# Patient Record
Sex: Female | Born: 1950 | Race: White | Hispanic: No | Smoking: Current every day smoker
Health system: Southern US, Community
[De-identification: ages and names within clinical notes are randomized; demographics above are authoritative.]

## PROBLEM LIST (undated history)

## (undated) DIAGNOSIS — I10 Essential (primary) hypertension: Secondary | ICD-10-CM

## (undated) DIAGNOSIS — E119 Type 2 diabetes mellitus without complications: Secondary | ICD-10-CM

## (undated) HISTORY — PX: ABDOMINAL HYSTERECTOMY: SHX81

---

## 2017-03-04 ENCOUNTER — Ambulatory Visit: Payer: Self-pay | Admitting: Psychiatry

## 2017-03-06 ENCOUNTER — Emergency Department
Admission: EM | Admit: 2017-03-06 | Discharge: 2017-03-06 | Disposition: A | Discharge status: Home / Self Care | Payer: Medicare HMO | Attending: Emergency Medicine | Admitting: Emergency Medicine

## 2017-03-06 ENCOUNTER — Emergency Department: Payer: Medicare HMO

## 2017-03-06 ENCOUNTER — Other Ambulatory Visit: Payer: Self-pay

## 2017-03-06 DIAGNOSIS — F17210 Nicotine dependence, cigarettes, uncomplicated: Secondary | ICD-10-CM | POA: Insufficient documentation

## 2017-03-06 DIAGNOSIS — E119 Type 2 diabetes mellitus without complications: Secondary | ICD-10-CM | POA: Insufficient documentation

## 2017-03-06 DIAGNOSIS — S0990XA Unspecified injury of head, initial encounter: Secondary | ICD-10-CM

## 2017-03-06 DIAGNOSIS — Z79899 Other long term (current) drug therapy: Secondary | ICD-10-CM | POA: Diagnosis not present

## 2017-03-06 DIAGNOSIS — Y998 Other external cause status: Secondary | ICD-10-CM | POA: Insufficient documentation

## 2017-03-06 DIAGNOSIS — W07XXXA Fall from chair, initial encounter: Secondary | ICD-10-CM | POA: Insufficient documentation

## 2017-03-06 DIAGNOSIS — R531 Weakness: Secondary | ICD-10-CM | POA: Insufficient documentation

## 2017-03-06 DIAGNOSIS — N390 Urinary tract infection, site not specified: Secondary | ICD-10-CM | POA: Insufficient documentation

## 2017-03-06 DIAGNOSIS — Z7984 Long term (current) use of oral hypoglycemic drugs: Secondary | ICD-10-CM | POA: Diagnosis not present

## 2017-03-06 DIAGNOSIS — I10 Essential (primary) hypertension: Secondary | ICD-10-CM | POA: Insufficient documentation

## 2017-03-06 DIAGNOSIS — Y92009 Unspecified place in unspecified non-institutional (private) residence as the place of occurrence of the external cause: Secondary | ICD-10-CM | POA: Diagnosis not present

## 2017-03-06 DIAGNOSIS — Y939 Activity, unspecified: Secondary | ICD-10-CM | POA: Insufficient documentation

## 2017-03-06 HISTORY — DX: Type 2 diabetes mellitus without complications: E11.9

## 2017-03-06 HISTORY — DX: Essential (primary) hypertension: I10

## 2017-03-06 LAB — CBC WITH DIFFERENTIAL/PLATELET
BASOS ABS: 0 10*3/uL (ref 0–0.1)
Basophils Relative: 0 %
Eosinophils Absolute: 0 10*3/uL (ref 0–0.7)
Eosinophils Relative: 0 %
HEMATOCRIT: 40.7 % (ref 35.0–47.0)
HEMOGLOBIN: 13.4 g/dL (ref 12.0–16.0)
Lymphocytes Relative: 5 %
Lymphs Abs: 0.5 10*3/uL — ABNORMAL LOW (ref 1.0–3.6)
MCH: 27.5 pg (ref 26.0–34.0)
MCHC: 32.9 g/dL (ref 32.0–36.0)
MCV: 83.6 fL (ref 80.0–100.0)
Monocytes Absolute: 0.4 10*3/uL (ref 0.2–0.9)
Monocytes Relative: 4 %
NEUTROS ABS: 9.2 10*3/uL — AB (ref 1.4–6.5)
Neutrophils Relative %: 91 %
Platelets: 277 10*3/uL (ref 150–440)
RBC: 4.86 MIL/uL (ref 3.80–5.20)
RDW: 14.8 % — ABNORMAL HIGH (ref 11.5–14.5)
WBC: 10.2 10*3/uL (ref 3.6–11.0)

## 2017-03-06 LAB — URINALYSIS, COMPLETE (UACMP) WITH MICROSCOPIC
BILIRUBIN URINE: NEGATIVE
Glucose, UA: NEGATIVE mg/dL
Ketones, ur: 5 mg/dL — AB
Nitrite: POSITIVE — AB
PROTEIN: 30 mg/dL — AB
SPECIFIC GRAVITY, URINE: 1.016 (ref 1.005–1.030)
pH: 5 (ref 5.0–8.0)

## 2017-03-06 LAB — COMPREHENSIVE METABOLIC PANEL
ALT: 24 U/L (ref 14–54)
AST: 36 U/L (ref 15–41)
Albumin: 3.9 g/dL (ref 3.5–5.0)
Alkaline Phosphatase: 86 U/L (ref 38–126)
Anion gap: 10 (ref 5–15)
BILIRUBIN TOTAL: 0.8 mg/dL (ref 0.3–1.2)
BUN: 13 mg/dL (ref 6–20)
CHLORIDE: 101 mmol/L (ref 101–111)
CO2: 26 mmol/L (ref 22–32)
CREATININE: 0.78 mg/dL (ref 0.44–1.00)
Calcium: 9.3 mg/dL (ref 8.9–10.3)
GFR calc Af Amer: >60 mL/min (ref >60)
Glucose, Bld: 132 mg/dL — ABNORMAL HIGH (ref 65–99)
Potassium: 3.7 mmol/L (ref 3.5–5.1)
Sodium: 137 mmol/L (ref 135–145)
TOTAL PROTEIN: 7.7 g/dL (ref 6.5–8.1)

## 2017-03-06 LAB — SALICYLATE LEVEL: Salicylate Lvl: <7 mg/dL (ref 2.8–30.0)

## 2017-03-06 LAB — URINE DRUG SCREEN, QUALITATIVE (ARMC ONLY)
Amphetamines, Ur Screen: NOT DETECTED
BARBITURATES, UR SCREEN: NOT DETECTED
BENZODIAZEPINE, UR SCRN: NOT DETECTED
Cannabinoid 50 Ng, Ur ~~LOC~~: NOT DETECTED
Cocaine Metabolite,Ur ~~LOC~~: NOT DETECTED
MDMA (Ecstasy)Ur Screen: NOT DETECTED
METHADONE SCREEN, URINE: NOT DETECTED
Opiate, Ur Screen: NOT DETECTED
Phencyclidine (PCP) Ur S: NOT DETECTED
TRICYCLIC, UR SCREEN: POSITIVE — AB

## 2017-03-06 LAB — CK: CK TOTAL: 170 U/L (ref 38–234)

## 2017-03-06 LAB — ACETAMINOPHEN LEVEL

## 2017-03-06 LAB — ETHANOL

## 2017-03-06 LAB — TROPONIN I

## 2017-03-06 MED ORDER — CEPHALEXIN 500 MG PO CAPS
500.0000 mg | ORAL_CAPSULE | Freq: Once | ORAL | Status: AC
  Administered 2017-03-06: 500 mg via ORAL
  Filled 2017-03-06: qty 1

## 2017-03-06 MED ORDER — CEPHALEXIN 500 MG PO CAPS: 500.0000 mg | ORAL_CAPSULE | Freq: Three times a day (TID) | ORAL | 0 refills | Status: AC

## 2017-03-06 NOTE — ED Triage Notes (Signed)
Pt arrived via ems for report of fall at 4am and weakness - pt has been laying in the floor since 4am - pt states that she was in her chair and leaned forward and fell face first on the floor causing laceration to bridge of nose - pt is A&O x3 and moved herself from stretcher to ED stretcher

## 2017-03-06 NOTE — ED Notes (Signed)
Pt ambulated from bed 1/4 a way around nursing station with cane and no assist without difficulty

## 2017-03-06 NOTE — ED Provider Notes (Signed)
[facility] Emergency Department Provider Note  ___________________________________________   First MD Initiated Contact with Patient 03/06/17 1721     (approximate)  I have reviewed the triage vital signs and the nursing notes.   HISTORY  Chief Complaint Fall and Weakness   HPI Sierra Bass is a 66 y.o. female with a history of diabetes as well as hypertension was presenting to the emergency department today after falling out of her recliner and being unable to get up.  Family member the patient was trying to contact her and was unable to get in touch.  EMS was called and upon arrival the patient reported being down on the floor since 4 AM this morning.  She says that she fell out of her recliner, hitting her nose and face on the floor.  She reports her tetanus shot being in the past 10 years.  She reports no pain at this time.  Denies loss of consciousness.  EMS reports that they were unable to have the patient walk because of the patient being unable to support her weight with her legs.  Patient does usually use a cane for help with ambulation.  Patient is denying any hip pain, chest pain or palpitations.  Denies any fever or body aches.  Patient has urinated on herself since being down on the floor over the past several hours.  Past Medical History:  Diagnosis Date  . Diabetes mellitus without complication (HCC)   . Hypertension     There are no active problems to display for this patient.   Past Surgical History:  Procedure Laterality Date  . ABDOMINAL HYSTERECTOMY    . CESAREAN SECTION      Prior to Admission medications   Medication Sig Start Date End Date Taking? Authorizing Provider  gabapentin (NEURONTIN) 300 MG capsule Take 300 mg by mouth 3 (three) times daily. 11/27/16   [provider]  lisinopril (PRINIVIL,ZESTRIL) 40 MG tablet Take 1 tablet by mouth daily. 03/02/17   [provider]  meloxicam (MOBIC) 15 MG tablet Take 1  tablet by mouth daily. 12/29/16   [provider]  metFORMIN (GLUCOPHAGE) 1000 MG tablet Take 1 tablet by mouth 2 (two) times daily. 12/06/16   [provider]  oxybutynin (DITROPAN-XL) 10 MG 24 hr tablet Take 25 mg by mouth daily.  02/23/17   [provider]  pramipexole (MIRAPEX) 1.5 MG tablet Take 1 tablet by mouth daily. 01/03/17   [provider]  ramipril (ALTACE) 10 MG capsule Take 1 capsule by mouth daily. 11/30/16   [provider]  simvastatin (ZOCOR) 10 MG tablet Take 1 tablet by mouth daily. 12/23/16   [provider]  venlafaxine XR (EFFEXOR-XR) 150 MG 24 hr capsule Take 2 capsules by mouth daily. 11/27/16   [provider]    Allergies Patient has no known allergies.  No family history on file.  Social History Social History   Tobacco Use  . Smoking status: Current Every Day Smoker    Packs/day: 0.50    Types: Cigarettes  . Smokeless tobacco: Never Used  Substance Use Topics  . Alcohol use: No    Frequency: Never  . Drug use: No    Review of Systems  Constitutional: No fever/chills Eyes: No visual changes. ENT: No sore throat. Cardiovascular: Denies chest pain. Respiratory: Denies shortness of breath. Gastrointestinal: No abdominal pain.  No nausea, no vomiting.  No diarrhea.  No constipation. Genitourinary: Negative for dysuria. Musculoskeletal: Negative for back pain.  Skin: Negative for rash. Neurological: Negative for headaches, focal weakness or numbness.   ____________________________________________   PHYSICAL EXAM:  VITAL SIGNS: ED Triage Vitals  Enc Vitals Group     BP 03/06/17 1719 136/76     Pulse Rate 03/06/17 1719 82     Resp 03/06/17 1719 (!) 21     Temp 03/06/17 1719 97.7 F (36.5 C)     Temp Source 03/06/17 1719 Oral     SpO2 03/06/17 1719 94 %     Weight 03/06/17 1714 260 lb (117.9 kg)     Height 03/06/17 1714 5\' 4"  (1.626 m)     Head Circumference --      Peak Flow --       Pain Score 03/06/17 1714 0     Pain Loc --      Pain Edu? --      Excl. in GC? --     Constitutional: Alert and oriented.  in no acute distress. Eyes: Conjunctivae are normal.  Head: Atraumatic. Nose: Abrasion overlying the nasal bridge but without laceration.  Swelling to the nose diffusely but without any nasal septal hematoma.  Mild crusted blood to the bilateral naris without active bleed visualized.  Tenderness to palpation to the bony structures of the nose externally.   Mouth/Throat: Mucous membranes are moist.  Neck: No stridor.  No tenderness to the cervical spine.  No deformity or step-off. Cardiovascular: Normal rate, regular rhythm. Grossly normal heart sounds.  Good peripheral circulation. Respiratory: Normal respiratory effort.  No retractions. Lungs CTAB. Gastrointestinal: Soft and nontender. No distention.  Musculoskeletal: No lower extremity tenderness nor edema.  No joint effusions.  Pelvis is stable.  No tenderness to palpation of the bilateral hips.  5 out of 5 strength in bilateral lower extremities.  No sensory deficits. Neurologic:  Normal speech and language. No gross focal neurologic deficits are appreciated. Skin:  Skin is warm, dry and intact. No rash noted. Psychiatric: Mood and affect are normal. Speech and behavior are normal.  ____________________________________________   LABS (all labs ordered are listed, but only abnormal results are displayed)  Labs Reviewed  CBC WITH DIFFERENTIAL/PLATELET - Abnormal; Notable for the following components:      Result Value   RDW 14.8 (*)    Neutro Abs 9.2 (*)    Lymphs Abs 0.5 (*)    All other components within normal limits  COMPREHENSIVE METABOLIC PANEL - Abnormal; Notable for the following components:   Glucose, Bld 132 (*)    All other components within normal limits  URINALYSIS, COMPLETE (UACMP) WITH MICROSCOPIC - Abnormal; Notable for the following components:   Color, Urine YELLOW (*)    APPearance HAZY  (*)    Hgb urine dipstick SMALL (*)    Ketones, ur 5 (*)    Protein, ur 30 (*)    Nitrite POSITIVE (*)    Leukocytes, UA TRACE (*)    Bacteria, UA MANY (*)    Squamous Epithelial / LPF 0-5 (*)    All other components within normal limits  URINE DRUG SCREEN, QUALITATIVE ([facility] ONLY) - Abnormal; Notable for the following components:   Tricyclic, Ur Screen POSITIVE (*)    All other components within normal limits  ACETAMINOPHEN LEVEL - Abnormal; Notable for the following components:   Acetaminophen (Tylenol), Serum <10 (*)    All other components within normal limits  ETHANOL  TROPONIN I  CK  SALICYLATE LEVEL   ____________________________________________  EKG  ED ECG REPORT I, Onalee Hua  Renard HamperM Manases Etchison, the attending physician, personally viewed and interpreted this ECG.   Date: 03/06/2017  EKG Time: 1719  Rate: 91  Rhythm: normal sinus rhythm  Axis: Normal  Intervals:none  ST&T Change: No ST segment elevation or depression.  No abnormal T wave inversion.  ____________________________________________  RADIOLOGY  Patient without acute finding on the CT maxillofacial, head as well as the chest x-ray and pelvic x-ray. ____________________________________________   PROCEDURES  Procedure(s) performed:   Procedures  Critical Care performed:   ____________________________________________   INITIAL IMPRESSION / ASSESSMENT AND PLAN / ED COURSE  Pertinent labs & imaging results that were available during my care of the patient were reviewed by me and considered in my medical decision making (see chart for details).  DDX: Weakness, hip fracture, pelvic fracture, UTI, nasal bone fracture, electrolyte abnormality, ACS, rhabdomyolysis, alcohol intoxication, drug abuse  As part of my medical decision making, I reviewed the following data within the electronic MEDICAL RECORD NUMBER Notes from prior ED visits     ----------------------------------------- 7:51 PM on  03/06/2017 -----------------------------------------  Patient found to have UTI.  Able to walk at her baseline with cane.  Patient without any complaints at this time.  Updated about her diagnosis of UTI.  Will be discharged with Keflex.  Patient is understanding of the plan willing to comply.  Patient not showing any focal weakness or generalized weakness at this time.  I feel she is appropriate for treatment at home.  ____________________________________________   FINAL CLINICAL IMPRESSION(S) / ED DIAGNOSES  Fall.  Head injury.  UTI.  Weakness.    NEW MEDICATIONS STARTED DURING THIS VISIT:  This SmartLink is deprecated. Use AVSMEDLIST instead to display the medication list for a patient.   Note:  This document was prepared using Dragon voice recognition software and may include unintentional dictation errors.     Myrna BlazerSchaevitz, Alcie Runions Matthew, MD 03/06/17 [phone]

## 2017-03-09 LAB — URINE CULTURE

## 2017-03-27 ENCOUNTER — Emergency Department: Payer: Medicare HMO

## 2017-03-27 ENCOUNTER — Encounter: Payer: Self-pay | Admitting: Emergency Medicine

## 2017-03-27 ENCOUNTER — Emergency Department
Admission: EM | Admit: 2017-03-27 | Discharge: 2017-03-27 | Disposition: A | Discharge status: Home / Self Care | Payer: Medicare HMO | Attending: Emergency Medicine | Admitting: Emergency Medicine

## 2017-03-27 ENCOUNTER — Other Ambulatory Visit: Payer: Self-pay

## 2017-03-27 DIAGNOSIS — W06XXXA Fall from bed, initial encounter: Secondary | ICD-10-CM | POA: Diagnosis not present

## 2017-03-27 DIAGNOSIS — Z7984 Long term (current) use of oral hypoglycemic drugs: Secondary | ICD-10-CM | POA: Insufficient documentation

## 2017-03-27 DIAGNOSIS — F17210 Nicotine dependence, cigarettes, uncomplicated: Secondary | ICD-10-CM | POA: Insufficient documentation

## 2017-03-27 DIAGNOSIS — Z79899 Other long term (current) drug therapy: Secondary | ICD-10-CM | POA: Diagnosis not present

## 2017-03-27 DIAGNOSIS — R531 Weakness: Secondary | ICD-10-CM | POA: Insufficient documentation

## 2017-03-27 DIAGNOSIS — I10 Essential (primary) hypertension: Secondary | ICD-10-CM | POA: Insufficient documentation

## 2017-03-27 DIAGNOSIS — W19XXXA Unspecified fall, initial encounter: Secondary | ICD-10-CM

## 2017-03-27 DIAGNOSIS — E119 Type 2 diabetes mellitus without complications: Secondary | ICD-10-CM | POA: Insufficient documentation

## 2017-03-27 DIAGNOSIS — I6782 Cerebral ischemia: Secondary | ICD-10-CM | POA: Insufficient documentation

## 2017-03-27 DIAGNOSIS — Z9181 History of falling: Secondary | ICD-10-CM | POA: Diagnosis not present

## 2017-03-27 LAB — CBC WITH DIFFERENTIAL/PLATELET
BASOS ABS: 0 10*3/uL (ref 0–0.1)
BASOS PCT: 1 %
EOS ABS: 0.2 10*3/uL (ref 0–0.7)
Eosinophils Relative: 3 %
HCT: 37.8 % (ref 35.0–47.0)
HEMOGLOBIN: 12.2 g/dL (ref 12.0–16.0)
Lymphocytes Relative: 13 %
Lymphs Abs: 0.8 10*3/uL — ABNORMAL LOW (ref 1.0–3.6)
MCH: 27 pg (ref 26.0–34.0)
MCHC: 32.4 g/dL (ref 32.0–36.0)
MCV: 83.4 fL (ref 80.0–100.0)
Monocytes Absolute: 0.5 10*3/uL (ref 0.2–0.9)
Monocytes Relative: 7 %
NEUTROS PCT: 76 %
Neutro Abs: 4.9 10*3/uL (ref 1.4–6.5)
Platelets: 265 10*3/uL (ref 150–440)
RBC: 4.53 MIL/uL (ref 3.80–5.20)
RDW: 15.2 % — ABNORMAL HIGH (ref 11.5–14.5)
WBC: 6.5 10*3/uL (ref 3.6–11.0)

## 2017-03-27 LAB — COMPREHENSIVE METABOLIC PANEL
ALT: 16 U/L (ref 14–54)
AST: 25 U/L (ref 15–41)
Albumin: 3.5 g/dL (ref 3.5–5.0)
Alkaline Phosphatase: 86 U/L (ref 38–126)
Anion gap: 7 (ref 5–15)
BUN: 7 mg/dL (ref 6–20)
CHLORIDE: 102 mmol/L (ref 101–111)
CO2: 29 mmol/L (ref 22–32)
Calcium: 8.8 mg/dL — ABNORMAL LOW (ref 8.9–10.3)
Creatinine, Ser: 0.46 mg/dL (ref 0.44–1.00)
Glucose, Bld: 112 mg/dL — ABNORMAL HIGH (ref 65–99)
POTASSIUM: 3.7 mmol/L (ref 3.5–5.1)
SODIUM: 138 mmol/L (ref 135–145)
Total Bilirubin: 0.6 mg/dL (ref 0.3–1.2)
Total Protein: 7 g/dL (ref 6.5–8.1)

## 2017-03-27 LAB — URINALYSIS, COMPLETE (UACMP) WITH MICROSCOPIC
BILIRUBIN URINE: NEGATIVE
Bacteria, UA: NONE SEEN
Glucose, UA: NEGATIVE mg/dL
HGB URINE DIPSTICK: NEGATIVE
Ketones, ur: NEGATIVE mg/dL
Leukocytes, UA: NEGATIVE
NITRITE: NEGATIVE
PROTEIN: NEGATIVE mg/dL
SPECIFIC GRAVITY, URINE: 1.005 (ref 1.005–1.030)
pH: 7 (ref 5.0–8.0)

## 2017-03-27 LAB — LIPASE, BLOOD: LIPASE: 18 U/L (ref 11–51)

## 2017-03-27 LAB — BRAIN NATRIURETIC PEPTIDE: B Natriuretic Peptide: 62 pg/mL (ref 0.0–100.0)

## 2017-03-27 LAB — TROPONIN I

## 2017-03-27 NOTE — ED Triage Notes (Signed)
Pt to ED via [facility] from [facility], pt has fallen x 2 today. First fall around 0630, pt refused transport at that time, about 15 minutes after EMS left, pt slide off the bed and has been in the floor since. Pt has laceration to the bridge of the nose and abrasion to the right knee.  VSS with EMS, CBG 113. Pt denies LOC or use of blood thinners. Pt in NAD at this time.

## 2017-03-27 NOTE — ED Notes (Signed)
Pt able to ambulate and maintain O2 Saturation of 95%

## 2017-03-27 NOTE — Discharge Instructions (Signed)
please follow-up with your regular doctor or see the [facility] or the [facility] or the [facility] or the [facility] or [facility] or the [facility] or you can try [facility] if need be. physical therapy might be useful for you to improve your strength. These returned here for feeling worse further falling fever vomiting or any other problems.

## 2017-03-27 NOTE — ED Notes (Signed)
Pt was given phone to call son for ride home upon discharge.

## 2017-03-27 NOTE — ED Provider Notes (Signed)
[facility] Emergency Department Provider Note   ____________________________________________   First MD Initiated Contact with Patient 03/27/17 1357     (approximate)  I have reviewed the triage vital signs and the nursing notes.   HISTORY  Chief Complaint Fall    HPI Sierra Bass is a 66 y.o. female Who has been having increasing weakness and increasing in number falls. [place] but has not found an apartment yet. She is living in a hotel. She fell once today called EMS but refused transport and then she slid off the side of the bed again it was on the floor. She's been unable to get up. She was in the ER recently with the same symptoms and had a UTI at that time. She denies any loss of consciousness headache chest pain shortness of breath coughing etc. she says she feels fairly well right now actuallyjust tired.   Past Medical History:  Diagnosis Date  . Diabetes mellitus without complication (HCC)   . Hypertension     There are no active problems to display for this patient.   Past Surgical History:  Procedure Laterality Date  . ABDOMINAL HYSTERECTOMY    . CESAREAN SECTION      Prior to Admission medications   Medication Sig Start Date End Date Taking? Authorizing Provider  gabapentin (NEURONTIN) 300 MG capsule Take 300 mg by mouth 3 (three) times daily. 11/27/16   [provider]  lisinopril (PRINIVIL,ZESTRIL) 40 MG tablet Take 1 tablet by mouth daily. 03/02/17   [provider]  meloxicam (MOBIC) 15 MG tablet Take 1 tablet by mouth daily. 12/29/16   [provider]  metFORMIN (GLUCOPHAGE) 1000 MG tablet Take 1 tablet by mouth 2 (two) times daily. 12/06/16   [provider]  oxybutynin (DITROPAN-XL) 10 MG 24 hr tablet Take 25 mg by mouth daily.  02/23/17   [provider]  pramipexole (MIRAPEX) 1.5 MG tablet Take 1 tablet by mouth daily. 01/03/17    [provider]  ramipril (ALTACE) 10 MG capsule Take 1 capsule by mouth daily. 11/30/16   [provider]  simvastatin (ZOCOR) 10 MG tablet Take 1 tablet by mouth daily. 12/23/16   [provider]  venlafaxine XR (EFFEXOR-XR) 150 MG 24 hr capsule Take 2 capsules by mouth daily. 11/27/16   [provider]    Allergies Abilify [aripiprazole]  No family history on file.  Social History Social History   Tobacco Use  . Smoking status: Current Every Day Smoker    Packs/day: 0.50    Types: Cigarettes  . Smokeless tobacco: Never Used  Substance Use Topics  . Alcohol use: No    Frequency: Never  . Drug use: No    Review of Systems  Constitutional: No fever/chills Eyes: No visual changes. ENT: No sore throat. Cardiovascular: Denies chest pain. Respiratory: Denies shortness of breath. Gastrointestinal: No abdominal pain.  No nausea, no vomiting.  No diarrhea.  No constipation. Genitourinary: Negative for dysuria. Musculoskeletal: Negative for back pain. Skin: Negative for rash. Neurological: Negative for headaches, focal weakness   ____________________________________________   PHYSICAL EXAM:  VITAL SIGNS: ED Triage Vitals  Enc Vitals Group     BP      Pulse      Resp      Temp      Temp src      SpO2      Weight  Height      Head Circumference      Peak Flow      Pain Score      Pain Loc      Pain Edu?      Excl. in GC?     Constitutional: Alert and oriented. Well appearing and in no acute distress. Eyes: Conjunctivae are normal.  Head: Atraumatic. Nose: No congestion/rhinnorhea. Mouth/Throat: Mucous membranes are moist.  Oropharynx non-erythematous. Neck: No stridor.   Cardiovascular: Normal rate, regular rhythm. Grossly normal heart sounds.  Good peripheral circulation. Respiratory: Normal respiratory effort.  No retractions. Lungs CTAB. Gastrointestinal: Soft and nontender. No distention. No abdominal bruits. No CVA  tenderness. Musculoskeletal: No lower extremity tenderness nor edema.  No joint effusions. Neurologic:  Normal speech and language. No gross focal neurologic deficits are appreciated. No gait instability. Skin:  Skin is warm, dry and intact except for a scabbing abrasion on the bridge of her nose and another 2 small scabs on the right knee. None of these are bigger than dime size.. slight redness on the right shin. The area is not warm. Patient says she had cellulitis there but is now much better. Psychiatric: Mood and affect are normal. Speech and behavior are normal.  ____________________________________________   LABS (all labs ordered are listed, but only abnormal results are displayed)  Labs Reviewed  COMPREHENSIVE METABOLIC PANEL - Abnormal; Notable for the following components:      Result Value   Glucose, Bld 112 (*)    Calcium 8.8 (*)    All other components within normal limits  CBC WITH DIFFERENTIAL/PLATELET - Abnormal; Notable for the following components:   RDW 15.2 (*)    Lymphs Abs 0.8 (*)    All other components within normal limits  URINALYSIS, COMPLETE (UACMP) WITH MICROSCOPIC - Abnormal; Notable for the following components:   Color, Urine STRAW (*)    APPearance CLEAR (*)    Squamous Epithelial / LPF 0-5 (*)    All other components within normal limits  LIPASE, BLOOD  TROPONIN I  BRAIN NATRIURETIC PEPTIDE   ____________________________________________  EKG  EKG read and interpreted by me shows normal sinus rhythm at 69 normal axis and low voltage in the chest leads but no apparent acute changes. She does have an S1 every 3 T3 which is not short of breath is not tachycardic and has no chest pain. ____________________________________________  RADIOLOGY  Dg Chest 2 View  Result Date: 03/27/2017 CLINICAL DATA:  Generalized weakness over the several past month. EXAM: CHEST  2 VIEW COMPARISON:  March 06, 2017 FINDINGS: The mediastinal contour is normal. The  heart size mildly enlarged. There is mild diffuse increased pulmonary interstitium. There is no pulmonary edema or pleural effusion. The visualized skeletal structures are unremarkable. IMPRESSION: Mild interstitial edema. Electronically Signed   By: Sherian Rein M.D.   On: 03/27/2017 15:04   Ct Head Wo Contrast  Result Date: 03/27/2017 CLINICAL DATA:  Multiple falls today. EXAM: CT HEAD WITHOUT CONTRAST TECHNIQUE: Contiguous axial images were obtained from the base of the skull through the vertex without intravenous contrast. COMPARISON:  03/06/2017 head CT. FINDINGS: Brain: No evidence of parenchymal hemorrhage or extra-axial fluid collection. No mass lesion, mass effect, or midline shift. No CT evidence of acute infarction. Nonspecific stable mild subcortical and periventricular white matter hypodensity, most in keeping with chronic small vessel ischemic change. Cerebral volume is age appropriate. No ventriculomegaly. Vascular: No acute abnormality. Skull: No evidence of calvarial fracture. Sinuses/Orbits: Mild opacification  of the right ethmoidal air cells. No fluid levels. Other:  The mastoid air cells are unopacified. IMPRESSION: 1. No evidence of acute intracranial abnormality. No evidence of calvarial fracture. 2. Mild chronic small vessel ischemic change in the cerebral white matter. Electronically Signed   By: Delbert PhenixJason A Poff M.D.   On: 03/27/2017 14:41   Dg Knee Complete 4 Views Right  Result Date: 03/27/2017 CLINICAL DATA:  Fall.  Right knee pain. EXAM: RIGHT KNEE - COMPLETE 4+ VIEW COMPARISON:  None. FINDINGS: No fracture, joint effusion or dislocation. No suspicious focal osseous lesion. Moderate tricompartmental right knee osteoarthritis. No radiopaque foreign bodies. IMPRESSION: 1. No right knee fracture, joint effusion or dislocation. 2. Moderate tricompartmental right knee osteoarthritis. Electronically Signed   By: Delbert PhenixJason A Poff M.D.   On: 03/27/2017 15:06   chest x-ray CT of the head and  knee films show no apparent pathology ____________________________________________   PROCEDURES  Procedure(s) performed:  Procedures  Critical Care performed:   ____________________________________________   INITIAL IMPRESSION / ASSESSMENT AND PLAN / ED COURSE  ----------------------------------------- 6:12 PM on 03/27/2017 -----------------------------------------  Patient walks in the emergency room without difficulty she does not desaturate labs urine x-rays etc. show no acute pathology. We will send her home and have her follow-up with her doctor suggested physical therapy to improve her strength.  Clinical Course as of Mar 27 1812  Sun Mar 27, 2017  1518 DG Chest 2 View [PM]    Clinical Course User Index [PM] Arnaldo NatalMalinda, Arslan Kier F, MD     ____________________________________________   FINAL CLINICAL IMPRESSION(S) / ED DIAGNOSES  Final diagnoses:  Fall, initial encounter     ED Discharge Orders    None       Note:  This document was prepared using Dragon voice recognition software and may include unintentional dictation errors.    Arnaldo NatalMalinda, Talana Slatten F, MD 03/27/17 [phone]

## 2017-03-28 ENCOUNTER — Ambulatory Visit: Payer: Self-pay | Admitting: Psychiatry

## 2017-04-21 ENCOUNTER — Inpatient Hospital Stay
Admission: EM | Admit: 2017-04-21 | Discharge: 2017-04-29 | DRG: 871 | Disposition: E | Payer: Medicare HMO | Attending: Internal Medicine | Admitting: Internal Medicine

## 2017-04-21 ENCOUNTER — Encounter: Payer: Self-pay | Admitting: Emergency Medicine

## 2017-04-21 ENCOUNTER — Emergency Department: Payer: Medicare HMO

## 2017-04-21 ENCOUNTER — Other Ambulatory Visit: Payer: Self-pay

## 2017-04-21 DIAGNOSIS — F411 Generalized anxiety disorder: Secondary | ICD-10-CM

## 2017-04-21 DIAGNOSIS — Z791 Long term (current) use of non-steroidal anti-inflammatories (NSAID): Secondary | ICD-10-CM

## 2017-04-21 DIAGNOSIS — G9341 Metabolic encephalopathy: Secondary | ICD-10-CM | POA: Diagnosis present

## 2017-04-21 DIAGNOSIS — E785 Hyperlipidemia, unspecified: Secondary | ICD-10-CM | POA: Diagnosis present

## 2017-04-21 DIAGNOSIS — K1121 Acute sialoadenitis: Secondary | ICD-10-CM | POA: Diagnosis present

## 2017-04-21 DIAGNOSIS — R7881 Bacteremia: Secondary | ICD-10-CM

## 2017-04-21 DIAGNOSIS — R5383 Other fatigue: Secondary | ICD-10-CM

## 2017-04-21 DIAGNOSIS — A4101 Sepsis due to Methicillin susceptible Staphylococcus aureus: Secondary | ICD-10-CM | POA: Diagnosis present

## 2017-04-21 DIAGNOSIS — Z6841 Body Mass Index (BMI) 40.0 and over, adult: Secondary | ICD-10-CM

## 2017-04-21 DIAGNOSIS — E872 Acidosis, unspecified: Secondary | ICD-10-CM

## 2017-04-21 DIAGNOSIS — N390 Urinary tract infection, site not specified: Secondary | ICD-10-CM

## 2017-04-21 DIAGNOSIS — R627 Adult failure to thrive: Secondary | ICD-10-CM | POA: Diagnosis present

## 2017-04-21 DIAGNOSIS — E860 Dehydration: Secondary | ICD-10-CM | POA: Diagnosis present

## 2017-04-21 DIAGNOSIS — A419 Sepsis, unspecified organism: Secondary | ICD-10-CM | POA: Diagnosis not present

## 2017-04-21 DIAGNOSIS — R6520 Severe sepsis without septic shock: Secondary | ICD-10-CM | POA: Diagnosis present

## 2017-04-21 DIAGNOSIS — F17210 Nicotine dependence, cigarettes, uncomplicated: Secondary | ICD-10-CM | POA: Diagnosis present

## 2017-04-21 DIAGNOSIS — Z515 Encounter for palliative care: Secondary | ICD-10-CM

## 2017-04-21 DIAGNOSIS — I10 Essential (primary) hypertension: Secondary | ICD-10-CM | POA: Diagnosis present

## 2017-04-21 DIAGNOSIS — E669 Obesity, unspecified: Secondary | ICD-10-CM | POA: Diagnosis present

## 2017-04-21 DIAGNOSIS — E874 Mixed disorder of acid-base balance: Secondary | ICD-10-CM | POA: Diagnosis present

## 2017-04-21 DIAGNOSIS — Z66 Do not resuscitate: Secondary | ICD-10-CM | POA: Diagnosis not present

## 2017-04-21 DIAGNOSIS — E870 Hyperosmolality and hypernatremia: Secondary | ICD-10-CM | POA: Diagnosis present

## 2017-04-21 DIAGNOSIS — I6310 Cerebral infarction due to embolism of unspecified precerebral artery: Secondary | ICD-10-CM | POA: Diagnosis not present

## 2017-04-21 DIAGNOSIS — J9601 Acute respiratory failure with hypoxia: Secondary | ICD-10-CM | POA: Diagnosis not present

## 2017-04-21 DIAGNOSIS — N179 Acute kidney failure, unspecified: Secondary | ICD-10-CM | POA: Diagnosis present

## 2017-04-21 DIAGNOSIS — R296 Repeated falls: Secondary | ICD-10-CM | POA: Diagnosis present

## 2017-04-21 DIAGNOSIS — Z7984 Long term (current) use of oral hypoglycemic drugs: Secondary | ICD-10-CM

## 2017-04-21 DIAGNOSIS — G9349 Other encephalopathy: Secondary | ICD-10-CM | POA: Diagnosis present

## 2017-04-21 DIAGNOSIS — R4020 Unspecified coma: Secondary | ICD-10-CM | POA: Diagnosis not present

## 2017-04-21 DIAGNOSIS — I639 Cerebral infarction, unspecified: Secondary | ICD-10-CM

## 2017-04-21 DIAGNOSIS — R410 Disorientation, unspecified: Secondary | ICD-10-CM

## 2017-04-21 DIAGNOSIS — E876 Hypokalemia: Secondary | ICD-10-CM | POA: Diagnosis present

## 2017-04-21 DIAGNOSIS — E119 Type 2 diabetes mellitus without complications: Secondary | ICD-10-CM | POA: Diagnosis present

## 2017-04-21 DIAGNOSIS — Z809 Family history of malignant neoplasm, unspecified: Secondary | ICD-10-CM

## 2017-04-21 DIAGNOSIS — R0602 Shortness of breath: Secondary | ICD-10-CM

## 2017-04-21 DIAGNOSIS — R32 Unspecified urinary incontinence: Secondary | ICD-10-CM | POA: Diagnosis present

## 2017-04-21 DIAGNOSIS — F329 Major depressive disorder, single episode, unspecified: Secondary | ICD-10-CM | POA: Diagnosis present

## 2017-04-21 DIAGNOSIS — W19XXXA Unspecified fall, initial encounter: Secondary | ICD-10-CM | POA: Diagnosis not present

## 2017-04-21 DIAGNOSIS — R0603 Acute respiratory distress: Secondary | ICD-10-CM | POA: Diagnosis not present

## 2017-04-21 DIAGNOSIS — Z823 Family history of stroke: Secondary | ICD-10-CM

## 2017-04-21 DIAGNOSIS — E43 Unspecified severe protein-calorie malnutrition: Secondary | ICD-10-CM | POA: Diagnosis present

## 2017-04-21 DIAGNOSIS — R0600 Dyspnea, unspecified: Secondary | ICD-10-CM

## 2017-04-21 LAB — COMPREHENSIVE METABOLIC PANEL
ALBUMIN: 3.5 g/dL (ref 3.5–5.0)
ALT: 46 U/L (ref 14–54)
AST: 57 U/L — AB (ref 15–41)
Alkaline Phosphatase: 88 U/L (ref 38–126)
Anion gap: 17 — ABNORMAL HIGH (ref 5–15)
BILIRUBIN TOTAL: 1.3 mg/dL — AB (ref 0.3–1.2)
BUN: 55 mg/dL — AB (ref 6–20)
CHLORIDE: 113 mmol/L — AB (ref 101–111)
CO2: 25 mmol/L (ref 22–32)
CREATININE: 1.52 mg/dL — AB (ref 0.44–1.00)
Calcium: 10.1 mg/dL (ref 8.9–10.3)
GFR calc Af Amer: 40 mL/min — ABNORMAL LOW (ref >60)
GFR, EST NON AFRICAN AMERICAN: 35 mL/min — AB (ref >60)
GLUCOSE: 253 mg/dL — AB (ref 65–99)
POTASSIUM: 3.2 mmol/L — AB (ref 3.5–5.1)
Sodium: 155 mmol/L — ABNORMAL HIGH (ref 135–145)
Total Protein: 7.5 g/dL (ref 6.5–8.1)

## 2017-04-21 LAB — URINALYSIS, ROUTINE W REFLEX MICROSCOPIC
Bilirubin Urine: NEGATIVE
Glucose, UA: NEGATIVE mg/dL
Ketones, ur: 5 mg/dL — AB
Nitrite: NEGATIVE
PROTEIN: 100 mg/dL — AB
SPECIFIC GRAVITY, URINE: 1.019 (ref 1.005–1.030)
pH: 5 (ref 5.0–8.0)

## 2017-04-21 LAB — GLUCOSE, CAPILLARY
Glucose-Capillary: 180 mg/dL — ABNORMAL HIGH (ref 65–99)
Glucose-Capillary: 173 mg/dL — ABNORMAL HIGH (ref 65–99)

## 2017-04-21 LAB — CBC WITH DIFFERENTIAL/PLATELET
Basophils Absolute: 0 10*3/uL (ref 0–0.1)
Basophils Relative: 0 %
Eosinophils Absolute: 0 10*3/uL (ref 0–0.7)
Eosinophils Relative: 0 %
HEMATOCRIT: 47.9 % — AB (ref 35.0–47.0)
HEMOGLOBIN: 15.3 g/dL (ref 12.0–16.0)
LYMPHS ABS: 0.8 10*3/uL — AB (ref 1.0–3.6)
Lymphocytes Relative: 5 %
MCH: 26.7 pg (ref 26.0–34.0)
MCHC: 31.9 g/dL — AB (ref 32.0–36.0)
MCV: 83.6 fL (ref 80.0–100.0)
MONO ABS: 1.3 10*3/uL — AB (ref 0.2–0.9)
MONOS PCT: 9 %
NEUTROS ABS: 11.9 10*3/uL — AB (ref 1.4–6.5)
NEUTROS PCT: 86 %
Platelets: 347 10*3/uL (ref 150–440)
RBC: 5.73 MIL/uL — ABNORMAL HIGH (ref 3.80–5.20)
RDW: 16.2 % — AB (ref 11.5–14.5)
WBC: 13.9 10*3/uL — ABNORMAL HIGH (ref 3.6–11.0)

## 2017-04-21 LAB — TROPONIN I
TROPONIN I: 0.07 ng/mL — AB (ref <0.03)
Troponin I: 0.08 ng/mL (ref <0.03)
Troponin I: 0.06 ng/mL (ref <0.03)

## 2017-04-21 LAB — LACTIC ACID, PLASMA
LACTIC ACID, VENOUS: 4 mmol/L — AB (ref 0.5–1.9)
LACTIC ACID, VENOUS: 3.1 mmol/L — AB (ref 0.5–1.9)

## 2017-04-21 LAB — LIPASE, BLOOD: LIPASE: 17 U/L (ref 11–51)

## 2017-04-21 LAB — APTT: aPTT: 26 seconds (ref 24–36)

## 2017-04-21 LAB — MAGNESIUM: Magnesium: 2.2 mg/dL (ref 1.7–2.4)

## 2017-04-21 LAB — MRSA PCR SCREENING: MRSA BY PCR: NEGATIVE

## 2017-04-21 LAB — CK: Total CK: 239 U/L — ABNORMAL HIGH (ref 38–234)

## 2017-04-21 MED ORDER — PIPERACILLIN-TAZOBACTAM 3.375 G IVPB 30 MIN
3.3750 g | Freq: Once | INTRAVENOUS | Status: AC
  Administered 2017-04-21: 3.375 g via INTRAVENOUS
  Filled 2017-04-21: qty 50

## 2017-04-21 MED ORDER — ASPIRIN 300 MG RE SUPP
300.0000 mg | Freq: Every day | RECTAL | Status: DC
  Administered 2017-04-21 – 2017-04-24 (×4): 300 mg via RECTAL
  Filled 2017-04-21 (×4): qty 1

## 2017-04-21 MED ORDER — VENLAFAXINE HCL ER 75 MG PO CP24: 300.0000 mg | ORAL_CAPSULE | Freq: Every day | ORAL | Status: DC

## 2017-04-21 MED ORDER — ACETAMINOPHEN 325 MG PO TABS: 650.0000 mg | ORAL_TABLET | ORAL | Status: DC | PRN

## 2017-04-21 MED ORDER — SODIUM CHLORIDE 0.9 % IV BOLUS (SEPSIS)
1000.0000 mL | Freq: Once | INTRAVENOUS | Status: AC
  Administered 2017-04-21: 1000 mL via INTRAVENOUS

## 2017-04-21 MED ORDER — HYDRALAZINE HCL 20 MG/ML IJ SOLN
10.0000 mg | Freq: Four times a day (QID) | INTRAMUSCULAR | Status: DC | PRN
Start: 2017-04-21 — End: 2017-04-24

## 2017-04-21 MED ORDER — INSULIN ASPART 100 UNIT/ML ~~LOC~~ SOLN
0.0000 [IU] | Freq: Three times a day (TID) | SUBCUTANEOUS | Status: DC
  Administered 2017-04-21 – 2017-04-23 (×5): 3 [IU] via SUBCUTANEOUS
  Administered 2017-04-23: 10:00:00 5 [IU] via SUBCUTANEOUS
  Administered 2017-04-24: 08:00:00 3 [IU] via SUBCUTANEOUS
  Filled 2017-04-21 (×6): qty 1

## 2017-04-21 MED ORDER — ACETAMINOPHEN 160 MG/5ML PO SOLN
650.0000 mg | ORAL | Status: DC | PRN
  Filled 2017-04-21: qty 20.3

## 2017-04-21 MED ORDER — PIPERACILLIN-TAZOBACTAM 3.375 G IVPB
3.3750 g | Freq: Three times a day (TID) | INTRAVENOUS | Status: DC
  Administered 2017-04-21 – 2017-04-22 (×4): 3.375 g via INTRAVENOUS
  Filled 2017-04-21 (×4): qty 50

## 2017-04-21 MED ORDER — PRAMIPEXOLE DIHYDROCHLORIDE 1 MG PO TABS: 1.5000 mg | ORAL_TABLET | Freq: Every day | ORAL | Status: DC

## 2017-04-21 MED ORDER — HEPARIN SODIUM (PORCINE) 5000 UNIT/ML IJ SOLN
5000.0000 [IU] | Freq: Three times a day (TID) | INTRAMUSCULAR | Status: DC
  Administered 2017-04-21 – 2017-04-24 (×8): 5000 [IU] via SUBCUTANEOUS
  Filled 2017-04-21 (×8): qty 1

## 2017-04-21 MED ORDER — STROKE: EARLY STAGES OF RECOVERY BOOK
Freq: Once | Status: AC
  Administered 2017-04-21: 16:00:00

## 2017-04-21 MED ORDER — OXYBUTYNIN CHLORIDE ER 5 MG PO TB24
25.0000 mg | ORAL_TABLET | Freq: Every day | ORAL | Status: DC
  Filled 2017-04-21 (×4): qty 1

## 2017-04-21 MED ORDER — ACETAMINOPHEN 650 MG RE SUPP
650.0000 mg | RECTAL | Status: DC | PRN
  Administered 2017-04-23 (×3): 650 mg via RECTAL
  Filled 2017-04-21 (×4): qty 1

## 2017-04-21 MED ORDER — ASPIRIN 325 MG PO TABS: 325.0000 mg | ORAL_TABLET | Freq: Every day | ORAL | Status: DC

## 2017-04-21 MED ORDER — ASPIRIN EC 325 MG PO TBEC: 325.0000 mg | DELAYED_RELEASE_TABLET | Freq: Every day | ORAL | Status: DC

## 2017-04-21 MED ORDER — SIMVASTATIN 20 MG PO TABS: 10.0000 mg | ORAL_TABLET | Freq: Every day | ORAL | Status: DC

## 2017-04-21 MED ORDER — SENNOSIDES-DOCUSATE SODIUM 8.6-50 MG PO TABS: 1.0000 | ORAL_TABLET | Freq: Every evening | ORAL | Status: DC | PRN

## 2017-04-21 MED ORDER — VANCOMYCIN HCL IN DEXTROSE 1-5 GM/200ML-% IV SOLN
1000.0000 mg | Freq: Once | INTRAVENOUS | Status: AC
  Administered 2017-04-21: 1000 mg via INTRAVENOUS
  Filled 2017-04-21: qty 200

## 2017-04-21 MED ORDER — DEXTROSE 5 % IV SOLN
INTRAVENOUS | Status: DC
  Administered 2017-04-21 – 2017-04-22 (×2): via INTRAVENOUS

## 2017-04-21 MED ORDER — SODIUM CHLORIDE 0.9 % IV SOLN
INTRAVENOUS | Status: DC
  Administered 2017-04-21: 16:00:00 via INTRAVENOUS

## 2017-04-21 MED ORDER — POTASSIUM CHLORIDE 10 MEQ/100ML IV SOLN
10.0000 meq | INTRAVENOUS | Status: AC
  Administered 2017-04-21 (×2): 10 meq via INTRAVENOUS
  Filled 2017-04-21 (×2): qty 100

## 2017-04-21 MED ORDER — VANCOMYCIN HCL 10 G IV SOLR: 1250.0000 mg | INTRAVENOUS | Status: DC

## 2017-04-21 NOTE — ED Triage Notes (Signed)
Pt to ED via EMS from home. They report the family told them that family went to the house yesterday and they found her on the floor. They helped her up twice. Today they went over and she was between the bed and wall so they called EMS.

## 2017-04-21 NOTE — ED Notes (Signed)
Code Sepsis. 

## 2017-04-21 NOTE — ED Notes (Signed)
In and out pt for urine. Sterile procedure. Erskine SquibbJane RN was my partner during procedure. Pt has very red with serosanguinous fluid.

## 2017-04-21 NOTE — H&P (Signed)
[facility] [place] [facility]   PATIENT NAME: Sierra Bass    MR#:  [id number]  DATE OF BIRTH:  07/28/1950  DATE OF ADMISSION:  2017-12-29  PRIMARY CARE PHYSICIAN: Jillyn Ledgersamantha Ziglar   REQUESTING/REFERRING PHYSICIAN: dr Fanny Bienquale  CHIEF COMPLAINT:    falls HISTORY OF PRESENT ILLNESS:  Sierra Bass  is a 67 y.o. female with a known history of diabetes and essential hypertension who presents via EMS due to falls. Son is at bedside. Patient has confusion and therefore I am unable to obtain history of present illness. Patient's son reports that on Saturday came to visit her and she was on the floor. He had to assist her back in the bed. He reports that she was talking and did not appear confused. He checked on her on Wednesday because he has not heard from her and again she was on the floor next to the bed. She seemed confused to him. Again today he went to check on her and she was very confused and was on the floor. Patient has had multiple falls in the past several days.  In the emergency room chest x-ray shows no acute infiltrate. UA is pending. CT shows possible as CVA.  PAST MEDICAL HISTORY:   Past Medical History:  Diagnosis Date  . Diabetes mellitus without complication (HCC)   . Hypertension     PAST SURGICAL HISTORY:   Past Surgical History:  Procedure Laterality Date  . ABDOMINAL HYSTERECTOMY    . CESAREAN SECTION      SOCIAL HISTORY:   Social History   Tobacco Use  . Smoking status: Current Every Day Smoker    Packs/day: 0.50    Types: Cigarettes  . Smokeless tobacco: Never Used  Substance Use Topics  . Alcohol use: No    Frequency: Never    FAMILY HISTORY:  History reviewed. No pertinent family history.  DRUG ALLERGIES:   Allergies  Allergen Reactions  . Abilify [Aripiprazole]     REVIEW OF SYSTEMS:   Review of Systems  Unable to perform ROS: Acuity of condition    MEDICATIONS AT HOME:   Prior to Admission medications    Medication Sig Start Date End Date Taking? Authorizing Provider  gabapentin (NEURONTIN) 300 MG capsule Take 300 mg by mouth 3 (three) times daily. 11/27/16   [provider]  lisinopril (PRINIVIL,ZESTRIL) 40 MG tablet Take 1 tablet by mouth daily. 03/02/17   [provider]  meloxicam (MOBIC) 15 MG tablet Take 1 tablet by mouth daily. 12/29/16   [provider]  metFORMIN (GLUCOPHAGE) 1000 MG tablet Take 1 tablet by mouth 2 (two) times daily. 12/06/16   [provider]  oxybutynin (DITROPAN-XL) 10 MG 24 hr tablet Take 25 mg by mouth daily.  02/23/17   [provider]  pramipexole (MIRAPEX) 1.5 MG tablet Take 1 tablet by mouth daily. 01/03/17   [provider]  ramipril (ALTACE) 10 MG capsule Take 1 capsule by mouth daily. 11/30/16   [provider]  simvastatin (ZOCOR) 10 MG tablet Take 1 tablet by mouth daily. 12/23/16   [provider]  venlafaxine XR (EFFEXOR-XR) 150 MG 24 hr capsule Take 2 capsules by mouth daily. 11/27/16   [provider]      VITAL SIGNS:  Blood pressure (!) 139/108, pulse (!) 116, temperature 98.3 F (36.8 C), temperature source Oral, height 5\' 2"  (1.575 m), weight 105.7 kg (233 lb), SpO2 95 %.  PHYSICAL EXAMINATION:   Physical Exam  Constitutional:  She is well-developed, well-nourished, and in no distress. No distress.  Obese  HENT:  Head: Normocephalic.  Eyes: No scleral icterus.  Neck: Normal range of motion. Neck supple. No JVD present. No tracheal deviation present.  Cardiovascular: Normal rate, regular rhythm and normal heart sounds. Exam reveals no gallop and no friction rub.  No murmur heard. Pulmonary/Chest: Effort normal and breath sounds normal. No respiratory distress. She has no wheezes. She has no rales. She exhibits no tenderness.  Abdominal: Soft. Bowel sounds are normal. She exhibits no distension and no mass. There is no tenderness. There is no rebound and no guarding.   Musculoskeletal: She exhibits no edema.  Neurological: She is alert.  Oriented only to name She does not follow commands well. She cannot lift her right lower extremity.  Skin: Skin is warm. No rash noted. No erythema.  Her right lower extremity looks mottled however she has good peripheral pulses. She has bruising on the lower abdomen  Psychiatric:  Confused      LABORATORY PANEL:   CBC Recent Labs  Lab 05/18/17 1047  WBC 13.9*  HGB 15.3  HCT 47.9*  PLT 347   ------------------------------------------------------------------------------------------------------------------  Chemistries  Recent Labs  Lab 2017-05-18 1047  NA 155*  K 3.2*  CL 113*  CO2 25  GLUCOSE 253*  BUN 55*  CREATININE 1.52*  CALCIUM 10.1  AST 57*  ALT 46  ALKPHOS 88  BILITOT 1.3*   ------------------------------------------------------------------------------------------------------------------  Cardiac Enzymes Recent Labs  Lab 18-May-2017 1047  TROPONINI 0.08*   ------------------------------------------------------------------------------------------------------------------  RADIOLOGY:  Ct Head Wo Contrast  Result Date: 2017-05-18 CLINICAL DATA:  Fall EXAM: CT HEAD WITHOUT CONTRAST CT CERVICAL SPINE WITHOUT CONTRAST TECHNIQUE: Multidetector CT imaging of the head and cervical spine was performed following the standard protocol without intravenous contrast. Multiplanar CT image reconstructions of the cervical spine were also generated. COMPARISON:  None. FINDINGS: CT HEAD FINDINGS Brain: Apparent low-density area within the inferior right cerebellar hemisphere. This area is difficult to evaluate by CT, but cannot exclude acute to subacute infarction. No hemorrhage or hydrocephalus. No midline shift. Vascular: No hyperdense vessel or unexpected calcification. Skull: No acute calvarial abnormality. Sinuses/Orbits: Visualized paranasal sinuses and mastoids clear. Orbital soft tissues unremarkable.  Other: Soft tissue swelling in the right lateral scalp. CT CERVICAL SPINE FINDINGS Alignment: Normal Skull base and vertebrae: No fracture Soft tissues and spinal canal: Prevertebral soft tissues are normal. No epidural or paraspinal hematoma. Disc levels: Degenerative disc disease from C5-6 through C7-T1. Disc space narrowing and spurring. Upper chest: Negative Other: No acute findings.  Carotid artery calcifications. IMPRESSION: Apparent low-density in the inferior right cerebellar hemisphere. This area is difficult to evaluate by CT and could be artifactual, but cannot exclude acute to subacute right cerebellar infarct. No evidence of hemorrhage. No acute bony abnormality in the cervical spine. Electronically Signed   By: Charlett Nose M.D.   On: 05-18-2017 11:17   Ct Cervical Spine Wo Contrast  Result Date: 05-18-2017 CLINICAL DATA:  Fall EXAM: CT HEAD WITHOUT CONTRAST CT CERVICAL SPINE WITHOUT CONTRAST TECHNIQUE: Multidetector CT imaging of the head and cervical spine was performed following the standard protocol without intravenous contrast. Multiplanar CT image reconstructions of the cervical spine were also generated. COMPARISON:  None. FINDINGS: CT HEAD FINDINGS Brain: Apparent low-density area within the inferior right cerebellar hemisphere. This area is difficult to evaluate by CT, but cannot exclude acute to subacute infarction. No hemorrhage or hydrocephalus. No midline shift. Vascular: No hyperdense vessel or unexpected calcification.  Skull: No acute calvarial abnormality. Sinuses/Orbits: Visualized paranasal sinuses and mastoids clear. Orbital soft tissues unremarkable. Other: Soft tissue swelling in the right lateral scalp. CT CERVICAL SPINE FINDINGS Alignment: Normal Skull base and vertebrae: No fracture Soft tissues and spinal canal: Prevertebral soft tissues are normal. No epidural or paraspinal hematoma. Disc levels: Degenerative disc disease from C5-6 through C7-T1. Disc space narrowing and  spurring. Upper chest: Negative Other: No acute findings.  Carotid artery calcifications. IMPRESSION: Apparent low-density in the inferior right cerebellar hemisphere. This area is difficult to evaluate by CT and could be artifactual, but cannot exclude acute to subacute right cerebellar infarct. No evidence of hemorrhage. No acute bony abnormality in the cervical spine. Electronically Signed   By: Charlett Nose M.D.   On: 05/19/17 11:17   Dg Chest Port 1 View  Result Date: 2017-05-19 CLINICAL DATA:  Suspect sepsis. EXAM: PORTABLE CHEST 1 VIEW COMPARISON:  None. FINDINGS: The heart size and mediastinal contours are within normal limits. Both lungs are clear. The visualized skeletal structures are unremarkable. IMPRESSION: No active disease. Electronically Signed   By: Signa Kell M.D.   On: May 19, 2017 11:27    EKG:  Sinus tachycardia PVCs no ST elevation or depression  IMPRESSION AND PLAN:   67 year old female with a history of diabetes and essential hypertension who has had multiple falls over the past several days and now presents to the emergency room with confusion and right-sided weakness.  1. Sepsis: Patient presents with leukocytosis, tachypnea, tachycardia and elevated lactic acid UA is pending Chest x-ray without pneumonia Continue Zosyn empirically Follow-up and UA Follow up on blood cultures  2. Acute/subacute right cerebellar infarct: CVA workup including MRI/MRA, echocardiogram and carotid Doppler ordered Start aspirin Check lipid panel and A1c PT, OT, speech and neurology consultation requested  3. Acute encephalopathy in the setting of sepsis with possible cerebellar infarct and hypernatremia Continue to monitor mental status Treatment as outlined above and below.  4. Hypernatremia: This is due to poor by mouth intake over the past several days with dehydration Monitor sodium level Patient has received IV fluids normal saline Start D5W  5. Diabetes: Close  monitoring blood sugars on D5 Diabetes nurse management consultation requested Sliding scale ordered.  6. Acute kidney injury in the setting of dehydration and poor by mouth intake Stop nephrotoxic medications at this time Continue IV fluids and repeat BMP in a.m.  7. Essential hypertension: Due to acute kidney injury will hold ACE inhibitor When necessary hydralazine ordered  8. Urinary incontinence: Continue to [place]  9. Hyperlipidemia: Continue statin    All the records are reviewed and case discussed with ED provider. Management plans discussed with the patient's son and he is in agreement  CODE STATUS: FULL  TOTAL TIME TAKING CARE OF THIS PATIENT: 43 minutes.    Delisha Peaden M.D on 05/19/2017 at 11:55 AM  Between 7am to 6pm - Pager - [phone]  After 6pm go to [email or web address] - Social research officer, government  [facility]  Office  [phone]  CC: Primary care physician; Jillyn Ledger

## 2017-04-21 NOTE — ED Provider Notes (Addendum)
[facility] Emergency Department Provider Note   ____________________________________________   First MD Initiated Contact with Patient 03/30/2017 1038     (approximate)  I have reviewed the triage vital signs and the nursing notes.   HISTORY  Chief Complaint Loss of Consciousness    EM caveat: The patient is confused, lethargic unable to provide a history.  The history obtained is from EMS and also from the patient's son, who reports he is her medical power of attorney and is at the bedside  HPI Dorissa Stinnette is a 67 y.o. female who presents after having multiple falls this week.  Patient son reports that on Saturday he came to her house and checked on her and she was on the floor, he had to assist her back in the bed.  She was talking, and seemed okay.  He then went and checked on her on Wednesday as he had not heard from her and again she was on the floor next to the bed and he helped her back into bed but she seemed more confused smelled poorly, and was not talking well to him.  Then today he again checked on her after she would not be able to get in contact, and he reports that she seemed very confused, could not get her up, extremely weak.  Reports had multiple falls in the last several days, she is smelled, and also has urinary incontinence.  Reports he is not aware of any allergies to medications, her record does show Abilify  Past Medical History:  Diagnosis Date  . Diabetes mellitus without complication (HCC)   . Hypertension     Patient Active Problem List   Diagnosis Date Noted  . Sepsis (HCC) 04/25/2017    Past Surgical History:  Procedure Laterality Date  . ABDOMINAL HYSTERECTOMY    . CESAREAN SECTION      Prior to Admission medications   Medication Sig Start Date End Date Taking? Authorizing Provider  gabapentin (NEURONTIN) 600 MG tablet Take 600 mg by mouth at bedtime.  11/27/16  Yes [provider]  lisinopril  (PRINIVIL,ZESTRIL) 40 MG tablet Take 1 tablet by mouth daily. 03/02/17  Yes [provider]  meloxicam (MOBIC) 15 MG tablet Take 1 tablet by mouth daily. 12/29/16  Yes [provider]  metFORMIN (GLUCOPHAGE) 1000 MG tablet Take 1 tablet by mouth 2 (two) times daily. 12/06/16  Yes [provider]  oxybutynin (DITROPAN-XL) 10 MG 24 hr tablet Take 10 mg by mouth 3 (three) times daily.  02/23/17  Yes [provider]  pramipexole (MIRAPEX) 1.5 MG tablet Take 1 tablet by mouth daily. 01/03/17  Yes [provider]  simvastatin (ZOCOR) 10 MG tablet Take 1 tablet by mouth daily. 12/23/16  Yes [provider]  venlafaxine XR (EFFEXOR-XR) 150 MG 24 hr capsule Take 2 capsules by mouth daily. 11/27/16  Yes [provider]    Allergies Abilify [aripiprazole]  History reviewed. No pertinent family history.  Social History Social History   Tobacco Use  . Smoking status: Current Every Day Smoker    Packs/day: 0.50    Types: Cigarettes  . Smokeless tobacco: Never Used  Substance Use Topics  . Alcohol use: No    Frequency: Never  . Drug use: No    Review of Systems EM caveat: The patient son does report that she seem to be getting more more weak and ill for at least about a week's time with multiple falls.  He is not aware  of any fevers or specific complaints but reports she seem to be getting more confused as well    ____________________________________________   PHYSICAL EXAM:  VITAL SIGNS: ED Triage Vitals  Enc Vitals Group     BP 04/10/2017 1048 (!) 139/108     Pulse Rate 04/05/2017 1048 (!) 116     Resp --      Temp 04/24/2017 1048 98.3 F (36.8 C)     Temp Source 04/23/2017 1048 Oral     SpO2 04/18/2017 1048 95 %     Weight 04/25/2017 1050 233 lb (105.7 kg)     Height 04/28/2017 1050 5\' 2"  (1.575 m)     Head Circumference --      Peak Flow --      Pain Score --      Pain Loc --      Pain Edu? --      Excl. in GC? --      Constitutional: Patient is lethargic, responds to voice is able to follow basic commands such as open her mouth, close her eyes, but is primarily very lethargic.  She appears to be protecting her airway, is in no acute distress but appears extremely ill Eyes: Conjunctivae are normal. Head: Atraumatic. Nose: No congestion/rhinnorhea. Mouth/Throat: Mucous membranes are extremely dry. Neck: No stridor.   Cardiovascular: Slightly tachycardic rate, regular rhythm. Grossly normal heart sounds.  Good peripheral circulation. Respiratory: Normal respiratory effort.  No retractions. Lungs CTAB. Gastrointestinal: Soft and nontender. No distention.  Is a periumbilical defect that appears to be chronic.  Soft nontender.  Some slight purulent oozing noted around the defect in the umbilical region. The patient has purpuric rash with a lacy appearance extending from the mid abdomen down to the feet.  She does have intact and strong dorsalis pedis pulses bilateral.  The tips of her toes do appear however poorly perfused with decreased capillary refill to about 3 seconds with cyanosis peripherally. Musculoskeletal: No lower extremity tenderness no deformity is noted.  Patient able to move all extremities to command but extremely weak. Neurologic: Somewhat incomprehensible at times, able to say her name with clarity.  Does follow commands.  Skin:  Skin is warm, cool and mottled.  Psychiatric: Flat, lethargic  ____________________________________________   LABS (all labs ordered are listed, but only abnormal results are displayed)  Labs Reviewed  LACTIC ACID, PLASMA - Abnormal; Notable for the following components:      Result Value   Lactic Acid, Venous 4.0 (*)    All other components within normal limits  COMPREHENSIVE METABOLIC PANEL - Abnormal; Notable for the following components:   Sodium 155 (*)    Potassium 3.2 (*)    Chloride 113 (*)    Glucose, Bld 253 (*)    BUN 55 (*)    Creatinine, Ser  1.52 (*)    AST 57 (*)    Total Bilirubin 1.3 (*)    GFR calc non Af Amer 35 (*)    GFR calc Af Amer 40 (*)    Anion gap 17 (*)    All other components within normal limits  TROPONIN I - Abnormal; Notable for the following components:   Troponin I 0.08 (*)    All other components within normal limits  CBC WITH DIFFERENTIAL/PLATELET - Abnormal; Notable for the following components:   WBC 13.9 (*)    RBC 5.73 (*)    HCT 47.9 (*)    MCHC 31.9 (*)    RDW 16.2 (*)  Neutro Abs 11.9 (*)    Lymphs Abs 0.8 (*)    Monocytes Absolute 1.3 (*)    All other components within normal limits  URINALYSIS, ROUTINE W REFLEX MICROSCOPIC - Abnormal; Notable for the following components:   Color, Urine AMBER (*)    APPearance TURBID (*)    Hgb urine dipstick MODERATE (*)    Ketones, ur 5 (*)    Protein, ur 100 (*)    Leukocytes, UA SMALL (*)    Bacteria, UA MANY (*)    Squamous Epithelial / LPF 6-30 (*)    All other components within normal limits  BLOOD GAS, VENOUS - Abnormal; Notable for the following components:   pCO2, Ven 43 (*)    All other components within normal limits  CK - Abnormal; Notable for the following components:   Total CK 239 (*)    All other components within normal limits  CULTURE, BLOOD (ROUTINE X 2)  CULTURE, BLOOD (ROUTINE X 2)  URINE CULTURE  LIPASE, BLOOD  APTT  LACTIC ACID, PLASMA   ____________________________________________  EKG  EKG is reviewed and are by me Sinus tachycardia, nonspecific T wave abnormality including depression seen in multiple leads, no evidence of acute ST elevation Heart rate 120 QRS 80 QTC 400 ____________________________________________  RADIOLOGY  Ct Head Wo Contrast  Result Date: 04/27/2017 CLINICAL DATA:  Fall EXAM: CT HEAD WITHOUT CONTRAST CT CERVICAL SPINE WITHOUT CONTRAST TECHNIQUE: Multidetector CT imaging of the head and cervical spine was performed following the standard protocol without intravenous contrast.  Multiplanar CT image reconstructions of the cervical spine were also generated. COMPARISON:  None. FINDINGS: CT HEAD FINDINGS Brain: Apparent low-density area within the inferior right cerebellar hemisphere. This area is difficult to evaluate by CT, but cannot exclude acute to subacute infarction. No hemorrhage or hydrocephalus. No midline shift. Vascular: No hyperdense vessel or unexpected calcification. Skull: No acute calvarial abnormality. Sinuses/Orbits: Visualized paranasal sinuses and mastoids clear. Orbital soft tissues unremarkable. Other: Soft tissue swelling in the right lateral scalp. CT CERVICAL SPINE FINDINGS Alignment: Normal Skull base and vertebrae: No fracture Soft tissues and spinal canal: Prevertebral soft tissues are normal. No epidural or paraspinal hematoma. Disc levels: Degenerative disc disease from C5-6 through C7-T1. Disc space narrowing and spurring. Upper chest: Negative Other: No acute findings.  Carotid artery calcifications. IMPRESSION: Apparent low-density in the inferior right cerebellar hemisphere. This area is difficult to evaluate by CT and could be artifactual, but cannot exclude acute to subacute right cerebellar infarct. No evidence of hemorrhage. No acute bony abnormality in the cervical spine. Electronically Signed   By: Charlett Nose M.D.   On: 04/07/2017 11:17   Ct Cervical Spine Wo Contrast  Result Date: 04/04/2017 CLINICAL DATA:  Fall EXAM: CT HEAD WITHOUT CONTRAST CT CERVICAL SPINE WITHOUT CONTRAST TECHNIQUE: Multidetector CT imaging of the head and cervical spine was performed following the standard protocol without intravenous contrast. Multiplanar CT image reconstructions of the cervical spine were also generated. COMPARISON:  None. FINDINGS: CT HEAD FINDINGS Brain: Apparent low-density area within the inferior right cerebellar hemisphere. This area is difficult to evaluate by CT, but cannot exclude acute to subacute infarction. No hemorrhage or hydrocephalus. No  midline shift. Vascular: No hyperdense vessel or unexpected calcification. Skull: No acute calvarial abnormality. Sinuses/Orbits: Visualized paranasal sinuses and mastoids clear. Orbital soft tissues unremarkable. Other: Soft tissue swelling in the right lateral scalp. CT CERVICAL SPINE FINDINGS Alignment: Normal Skull base and vertebrae: No fracture Soft tissues and spinal canal: Prevertebral soft  tissues are normal. No epidural or paraspinal hematoma. Disc levels: Degenerative disc disease from C5-6 through C7-T1. Disc space narrowing and spurring. Upper chest: Negative Other: No acute findings.  Carotid artery calcifications. IMPRESSION: Apparent low-density in the inferior right cerebellar hemisphere. This area is difficult to evaluate by CT and could be artifactual, but cannot exclude acute to subacute right cerebellar infarct. No evidence of hemorrhage. No acute bony abnormality in the cervical spine. Electronically Signed   By: Charlett NoseKevin  Dover M.D.   On: 01/21/2018 11:17   Dg Chest Port 1 View  Result Date: 12/28/17 CLINICAL DATA:  Suspect sepsis. EXAM: PORTABLE CHEST 1 VIEW COMPARISON:  None. FINDINGS: The heart size and mediastinal contours are within normal limits. Both lungs are clear. The visualized skeletal structures are unremarkable. IMPRESSION: No active disease. Electronically Signed   By: Signa Kellaylor  Stroud M.D.   On: 01/21/2018 11:27    CT of the head and cervical spine reviewed, some possible artifact versus acute to subacute right cerebellar infarct.  Last known well was several days ago, she was found on the ground yesterday, but last known well is well beyond any TPA window. ____________________________________________   PROCEDURES  Procedure(s) performed: None  Procedures  Critical Care performed: Yes, see critical care note(s)  Patient presents critical condition, obviously quite ill.  Is protecting her airway with normal oxygen saturations but appears acutely ill with evidence  of severe peripheral cyanosis and a lacy purpuric rash in lower extremities.  Given the history associated she has had multiple falls in the last several days out of her bed.  Son reports that she had a progressive worsening weakness, abnormal odor, and urinary incontinence highly suspicious for possible urinary tract infections or other acute infection and possible bacteremia with sepsis.  I have ordered broad-spectrum antibiotics, in addition a broad workup including chest x-ray, CT of the head and cervical spine, fluid resuscitation, and we will continue to monitor the patient's respiratory and mental status very closely.  CRITICAL CARE Performed by: Sharyn CreamerMark Zarrah Loveland   Total critical care time: 65 minutes  Critical care time was exclusive of separately billable procedures and treating other patients.  Critical care was necessary to treat or prevent imminent or life-threatening deterioration.  Critical care was time spent personally by me on the following activities: development of treatment plan with patient and/or surrogate as well as nursing, discussions with consultants, evaluation of patient's response to treatment, examination of patient, obtaining history from patient or surrogate, ordering and performing treatments and interventions, ordering and review of laboratory studies, ordering and review of radiographic studies, pulse oximetry and re-evaluation of patient's condition.  Patient's son who is healthcare power of attorney updated on the critical nature of his mother's condition. ____________________________________________   INITIAL IMPRESSION / ASSESSMENT AND PLAN / ED COURSE  Pertinent labs & imaging results that were available during my care of the patient were reviewed by me and considered in my medical decision making (see chart for details).  The patient presents for evaluation of lethargy and multiple falls.  Found to be tachycardic, confused, but without obvious focal deficit  but her neurologic exam is poor with lethargy making it very difficult to isolate if there is in fact a focal finding.  She does have abnormal urine odor, clearly dehydrated, elevated lactate and given the history provided I am concerned about multiple potential diagnoses including lethargy and altered mental status with fall secondary to sepsis, possible urinary source is urine is pending at also consider the  possibility of a cerebellar infarct, will need further workup under the hospitalist service.  The patient with elevated lactic acidosis, IV fluid resuscitation started in the ER.  Minimally elevated troponin, hospitalist starting patient on salicylates.  EKG demonstrates a subendocardial appearance, but no evidence of ST elevation.  ED Sepsis - Repeat Assessment   Performed at: ----------------------------------------- 12:01 PM on 05-01-2017 -----------------------------------------  Reevaluated, capillary refill improved, mental status improving.  Heart rate and vital signs slowly improving as well.  Appears to be improving after fluid resuscitation.  No evidence of volume overload noted at this time, no hypoxia no evidence of increased work of breathing    Please note, extensive workup is still pending at the time of admission the hospital.  UA pending.        ____________________________________________   FINAL CLINICAL IMPRESSION(S) / ED DIAGNOSES  Final diagnoses:  Dehydration  Disorientation  Lethargy  Falls, initial encounter  Lactic acidosis  Urinary tract infection, acute  Severe sepsis (HCC)      NEW MEDICATIONS STARTED DURING THIS VISIT:  New Prescriptions   No medications on file     Note:  This document was prepared using Dragon voice recognition software and may include unintentional dictation errors.     Sharyn Creamer, MD 05/01/17 1235    Sharyn Creamer, MD 05/02/17 [phone]

## 2017-04-21 NOTE — Progress Notes (Signed)
Family Meeting Note  Advance Directive:yes  Today a meeting took place with the Patient's son.  Patient is unable to participate due ZO:[id number]:Lacked capacity Acute encephalopathy, metabolic   The following clinical team members were present during this meeting:MD  The following were discussed:Patient's diagnosis: Sepsis Acute encephalopathy Possible CVA , Patient's progosis: Unable to determine and Goals for treatment: Full Code  Additional follow-up to be provided: Family will bring in healthcare power of attorney and advanced directives  Time spent during discussion: 16 minutes  Kaytlynn Kochan, MD

## 2017-04-21 NOTE — Progress Notes (Signed)
ANTIBIOTIC CONSULT NOTE - INITIAL  Pharmacy Consult for Zosyn and vancomycin Indication: sepsis  Allergies  Allergen Reactions  . Abilify [Aripiprazole]     Tardive dyskinesia     Patient Measurements: Height: 5\' 2"  (157.5 cm) Weight: 233 lb (105.7 kg) IBW/kg (Calculated) : 50.1 Adjusted Body Weight:   Vital Signs: Temp: 98.3 F (36.8 C) (01/24 1048) Temp Source: Oral (01/24 1048) BP: 108/68 (01/24 1205) Pulse Rate: 112 (01/24 1205) Intake/Output from previous day: No intake/output data recorded. Intake/Output from this shift: No intake/output data recorded.  Labs: Recent Labs    12-29-17 1047  WBC 13.9*  HGB 15.3  PLT 347  CREATININE 1.52*   Estimated Creatinine Clearance: 42.1 mL/min (A) (by C-G formula based on SCr of 1.52 mg/dL (H)). No results for input(s): VANCOTROUGH, VANCOPEAK, VANCORANDOM, GENTTROUGH, GENTPEAK, GENTRANDOM, TOBRATROUGH, TOBRAPEAK, TOBRARND, AMIKACINPEAK, AMIKACINTROU, AMIKACIN in the last 72 hours.   Microbiology: No results found for this or any previous visit (from the past 720 hour(s)).  Medical History: Past Medical History:  Diagnosis Date  . Diabetes mellitus without complication (HCC)   . Hypertension     Medications:  Infusions:  . piperacillin-tazobactam (ZOSYN)  IV    . vancomycin    . vancomycin 1,000 mg (12-29-17 1121)   Assessment: 66 yof cc falls with PMH DM, HTN. Patient's son found her on the floor multiple times in past week. CXR shows no acute infiltrate, UA pending, CT shows possible CVA. Patient meets sepsis criteria with leukocytosis, tachypnea, tachycardia, elevated lactic acid. Pharmacy consulted to dose Zosyn and vancomycin for sepsis.  Goal of Therapy:  Vancomycin trough level 15-20 mcg/ml  Plan:  1. Zosyn 3.375 gm IV Q8H EI 2. Vancomycin 1 gm IV x 1 in ED Followed in approximately 8 hours (stacked dosing) by vancomycin 1.25 gm IV Q24H, predicted trough 16 mcg/mL. Pharmacy will continue to follow and  adjust as needed to maintain trough 15 to 20 mcg/mL.   Vd 50.4 L, Ke 0.039 hr-1, T1/2 17.7 hr  Carola FrostNathan A Jorgen Wolfinger, Pharm.D., BCPS Clinical Pharmacist 2017-05-18,12:07 PM

## 2017-04-21 NOTE — Progress Notes (Signed)
Inpatient Diabetes Program Recommendations  AACE/ADA: New Consensus Statement on Inpatient Glycemic Control (2015)  Target Ranges:  Prepandial:   less than 140 mg/dL      Peak postprandial:   less than 180 mg/dL (1-2 hours)      Critically ill patients:  140 - 180 mg/dL   Results for Sierra Bass, Sierra Bass (MRN [id number]) as of 25-Dec-2017 12:05  Ref. Range 25-Dec-2017 10:47  Glucose Latest Ref Range: 65 - 99 mg/dL 324253 (H)   Review of Glycemic Control  Diabetes history: DM2 Outpatient Diabetes medications: Metformin 1000 mg BID Current orders for Inpatient glycemic control: Novolog 0-15 units TID with meals (order signed and held; not yet released)  Inpatient Diabetes Program Recommendations: Correction (SSI): Please consider ordering Novolog 0-5 units QHS for bedtime correction. HgbA1C: A1C has been ordered.   NOTE: Noted consult for Diabetes Coordinator. Patient is currently in Emergency Department but will be admitted. Chart reviewed. Agree with orders but would recommend adding Novolog 0-5 units QHS for bedtime correction. Will continue to follow along.  Thanks, Orlando PennerMarie Safiatou Islam, RN, MSN, CDE Diabetes Coordinator Inpatient Diabetes Program [phone] (Team Pager from 8am to 5pm)

## 2017-04-22 ENCOUNTER — Inpatient Hospital Stay: Payer: Medicare HMO

## 2017-04-22 ENCOUNTER — Inpatient Hospital Stay
Admit: 2017-04-22 | Discharge: 2017-04-22 | Disposition: A | Discharge status: Home / Self Care | Payer: Medicare HMO | Attending: Internal Medicine | Admitting: Internal Medicine

## 2017-04-22 DIAGNOSIS — I639 Cerebral infarction, unspecified: Secondary | ICD-10-CM

## 2017-04-22 LAB — ECHOCARDIOGRAM COMPLETE
Height: 62 in
Weight: 3728 oz

## 2017-04-22 LAB — BLOOD CULTURE ID PANEL (REFLEXED)
ACINETOBACTER BAUMANNII: NOT DETECTED
CANDIDA KRUSEI: NOT DETECTED
CANDIDA PARAPSILOSIS: NOT DETECTED
CARBAPENEM RESISTANCE: NOT DETECTED
Candida albicans: NOT DETECTED
Candida glabrata: NOT DETECTED
Candida tropicalis: NOT DETECTED
ENTEROCOCCUS SPECIES: NOT DETECTED
Enterobacter cloacae complex: NOT DETECTED
Enterobacteriaceae species: NOT DETECTED
Escherichia coli: NOT DETECTED
Haemophilus influenzae: NOT DETECTED
KLEBSIELLA OXYTOCA: NOT DETECTED
KLEBSIELLA PNEUMONIAE: NOT DETECTED
LISTERIA MONOCYTOGENES: NOT DETECTED
Methicillin resistance: NOT DETECTED
NEISSERIA MENINGITIDIS: NOT DETECTED
Proteus species: NOT DETECTED
Pseudomonas aeruginosa: NOT DETECTED
SERRATIA MARCESCENS: NOT DETECTED
STAPHYLOCOCCUS AUREUS BCID: DETECTED — AB
STAPHYLOCOCCUS SPECIES: DETECTED — AB
Streptococcus agalactiae: NOT DETECTED
Streptococcus pneumoniae: NOT DETECTED
Streptococcus pyogenes: NOT DETECTED
Streptococcus species: NOT DETECTED
VANCOMYCIN RESISTANCE: NOT DETECTED

## 2017-04-22 LAB — CBC
HEMATOCRIT: 42.3 % (ref 35.0–47.0)
HEMOGLOBIN: 13.5 g/dL (ref 12.0–16.0)
MCH: 26.8 pg (ref 26.0–34.0)
MCHC: 32 g/dL (ref 32.0–36.0)
MCV: 83.5 fL (ref 80.0–100.0)
Platelets: 295 10*3/uL (ref 150–440)
RBC: 5.06 MIL/uL (ref 3.80–5.20)
RDW: 16 % — ABNORMAL HIGH (ref 11.5–14.5)
WBC: 7.5 10*3/uL (ref 3.6–11.0)

## 2017-04-22 LAB — GLUCOSE, CAPILLARY
GLUCOSE-CAPILLARY: 190 mg/dL — AB (ref 65–99)
GLUCOSE-CAPILLARY: 178 mg/dL — AB (ref 65–99)
Glucose-Capillary: 154 mg/dL — ABNORMAL HIGH (ref 65–99)

## 2017-04-22 LAB — LIPID PANEL
CHOLESTEROL: 111 mg/dL (ref 0–200)
HDL: 26 mg/dL — ABNORMAL LOW (ref >40)
LDL Cholesterol: 59 mg/dL (ref 0–99)
Total CHOL/HDL Ratio: 4.3 RATIO
Triglycerides: 129 mg/dL (ref <150)
VLDL: 26 mg/dL (ref 0–40)

## 2017-04-22 LAB — BLOOD GAS, ARTERIAL
ACID-BASE EXCESS: 4.9 mmol/L — AB (ref 0.0–2.0)
BICARBONATE: 29.2 mmol/L — AB (ref 20.0–28.0)
FIO2: 0.21
O2 Saturation: 94.1 %
PCO2 ART: 41 mmHg (ref 32.0–48.0)
PH ART: 7.46 — AB (ref 7.350–7.450)
PO2 ART: 67 mmHg — AB (ref 83.0–108.0)
Patient temperature: 37

## 2017-04-22 LAB — MAGNESIUM: MAGNESIUM: 2.1 mg/dL (ref 1.7–2.4)

## 2017-04-22 LAB — BASIC METABOLIC PANEL
ANION GAP: 10 (ref 5–15)
BUN: 44 mg/dL — ABNORMAL HIGH (ref 6–20)
CHLORIDE: 115 mmol/L — AB (ref 101–111)
CO2: 28 mmol/L (ref 22–32)
Calcium: 9 mg/dL (ref 8.9–10.3)
Creatinine, Ser: 0.89 mg/dL (ref 0.44–1.00)
GFR calc non Af Amer: >60 mL/min (ref >60)
Glucose, Bld: 200 mg/dL — ABNORMAL HIGH (ref 65–99)
Potassium: 2.8 mmol/L — ABNORMAL LOW (ref 3.5–5.1)
Sodium: 153 mmol/L — ABNORMAL HIGH (ref 135–145)

## 2017-04-22 LAB — POTASSIUM: POTASSIUM: 3.2 mmol/L — AB (ref 3.5–5.1)

## 2017-04-22 LAB — HEMOGLOBIN A1C
Hgb A1c MFr Bld: 5.9 % — ABNORMAL HIGH (ref 4.8–5.6)
MEAN PLASMA GLUCOSE: 122.63 mg/dL

## 2017-04-22 LAB — TROPONIN I: TROPONIN I: 0.05 ng/mL — AB (ref <0.03)

## 2017-04-22 LAB — PHOSPHORUS: Phosphorus: 2.3 mg/dL — ABNORMAL LOW (ref 2.5–4.6)

## 2017-04-22 LAB — SODIUM: Sodium: 155 mmol/L — ABNORMAL HIGH (ref 135–145)

## 2017-04-22 LAB — LACTIC ACID, PLASMA: Lactic Acid, Venous: 1.8 mmol/L (ref 0.5–1.9)

## 2017-04-22 MED ORDER — POTASSIUM CHLORIDE 10 MEQ/100ML IV SOLN
10.0000 meq | Freq: Once | INTRAVENOUS | Status: AC
  Administered 2017-04-22: 10 meq via INTRAVENOUS
  Filled 2017-04-22: qty 100

## 2017-04-22 MED ORDER — VANCOMYCIN HCL IN DEXTROSE 1-5 GM/200ML-% IV SOLN
1000.0000 mg | Freq: Two times a day (BID) | INTRAVENOUS | Status: DC
  Filled 2017-04-22: qty 200

## 2017-04-22 MED ORDER — INSULIN ASPART 100 UNIT/ML ~~LOC~~ SOLN: 0.0000 [IU] | Freq: Every day | SUBCUTANEOUS | Status: DC

## 2017-04-22 MED ORDER — VANCOMYCIN HCL IN DEXTROSE 1-5 GM/200ML-% IV SOLN
1000.0000 mg | Freq: Once | INTRAVENOUS | Status: AC
  Administered 2017-04-22: 14:00:00 1000 mg via INTRAVENOUS
  Filled 2017-04-22: qty 200

## 2017-04-22 MED ORDER — POTASSIUM CHLORIDE IN NACL 20-0.9 MEQ/L-% IV SOLN
INTRAVENOUS | Status: DC
  Administered 2017-04-22: 15:00:00 via INTRAVENOUS
  Filled 2017-04-22 (×2): qty 1000

## 2017-04-22 MED ORDER — POTASSIUM CL IN DEXTROSE 5% 20 MEQ/L IV SOLN
20.0000 meq | INTRAVENOUS | Status: DC
  Administered 2017-04-22 – 2017-04-23 (×2): 20 meq via INTRAVENOUS
  Filled 2017-04-22 (×3): qty 1000

## 2017-04-22 MED ORDER — INSULIN ASPART 100 UNIT/ML ~~LOC~~ SOLN: 0.0000 [IU] | Freq: Three times a day (TID) | SUBCUTANEOUS | Status: DC

## 2017-04-22 MED ORDER — CEFAZOLIN SODIUM-DEXTROSE 2-4 GM/100ML-% IV SOLN
2.0000 g | Freq: Three times a day (TID) | INTRAVENOUS | Status: DC
  Administered 2017-04-22 – 2017-04-24 (×6): 2 g via INTRAVENOUS
  Filled 2017-04-22 (×8): qty 100

## 2017-04-22 MED ORDER — ORAL CARE MOUTH RINSE
15.0000 mL | Freq: Two times a day (BID) | OROMUCOSAL | Status: DC
  Administered 2017-04-22 – 2017-04-24 (×3): 15 mL via OROMUCOSAL

## 2017-04-22 MED ORDER — POTASSIUM CHLORIDE 10 MEQ/100ML IV SOLN
10.0000 meq | INTRAVENOUS | Status: AC
  Administered 2017-04-22 (×3): 10 meq via INTRAVENOUS
  Filled 2017-04-22 (×3): qty 100

## 2017-04-22 NOTE — Consult Note (Signed)
WOC Nurse wound consult note Reason for Consult:Moisture associated skin damage (MASD)to perineal, inframammary region and buttocks.  Present on admission.  Wound type:MASD Pressure Injury POA: NA Measurement:Generalized erythema and denuded skin present with scant weeping Wound AVW:UJWJbed:pink and moist Drainage (amount, consistency, odor) scant weeping Periwound:erythema Dressing procedure/placement/frequency:Interdry Ag to affected skin folds:  Measure and cut length of InterDry Ag+ to fit in skin folds that have skin breakdown Tuck InterDry  Ag+ fabric into skin folds in a single layer, allow for 2 inches of overhang from skin edges to allow for wicking to occur May remove to bathe; dry area thoroughly and then tuck into affected areas again Do not apply any creams or ointments when using InterDry Ag+ DO NOT THROW AWAY FOR 5 DAYS unless soiled with stool DO NOT WASH product, this will inactivate the silver in the material  New sheet of Interdry Ag+ should be applied after 5 days of use if patient continues to have skin breakdown   Will not follow at this time.  Please re-consult if needed.  Maple HudsonKaren Terry Abila RN BSN CWON Pager [phone]

## 2017-04-22 NOTE — Progress Notes (Signed)
SLP Cancellation Note  Patient Details Name: Sierra Bass MRN: [id number] DOB: 09/24/1950   Cancelled treatment:       Reason Eval/Treat Not Completed: Fatigue/lethargy limiting ability to participate;Medical issues which prohibited therapy(chart reviewed; NSG consulted. ). NSG reported pt was poorly arouseable to alert for participation in evaluation. Pt was admitted w/ Acute encephalopathy, metabolic, per MD note.  ST services will f/u tomorrow w/ pt's status for Cognitive assessment if appropriate. Pt is currently NPO also; NSG aware. Recommend aspiration precautions; oral care.    Sierra SomKatherine Verginia Toohey, MS, CCC-SLP [place] 04/22/2017, 10:11 AM

## 2017-04-22 NOTE — Progress Notes (Addendum)
OT Cancellation Note  Patient Details Name: Sierra AmosCelia Sparling MRN: [id number] DOB: 12/25/1950   Cancelled Treatment:     Attempted to see patient this am, neurologist in to see patient, will continue attempts for OT evaluation.  Patient noted to be lethargic and unable to arouse for active participation this date in therapy.  Will continue attempts.  Willer Osorno T Kairen Hallinan, OTR/L, CLT   Bartlomiej Jenkinson 04/22/2017, 10:14 AM

## 2017-04-22 NOTE — Progress Notes (Signed)
Initial Nutrition Assessment  DOCUMENTATION CODES:   Obesity unspecified  INTERVENTION:  Monitor GOC  If unable to advance diet within the next 3 days and family wants aggressive care, recommend tube-feeding  NUTRITION DIAGNOSIS:   Unintentional weight loss related to acute illness as evidenced by percent weight loss.  GOAL:   Patient will meet greater than or equal to 90% of their needs  MONITOR:   Diet advancement, Labs, I & O's, Weight trends, TF tolerance  REASON FOR ASSESSMENT:   Consult Assessment of nutrition requirement/status  ASSESSMENT:   67 year old female with a history of diabetes and essential hypertension who has had multiple falls over the past several days and now presents to the emergency room with, right-sided weakness, CVA, acute encephelopathy and sepsis  Spoke with patient's family at bedside. Per son, she has had no PO intake for 1 week related to falls. He visited her on Saturday and she was on the floor, went back to visit her on Wednesday because he had not heard from her and found her on the floor again, however she was confused this time. Patient somnolent at bedside, un-arousable. According to family, she was 265 pounds on Dec 1st, now 233 pounds, a 12% severe weight loss over 2 months. Unsure how much of this weight was lost during the past week where she had no PO intake. Unable to dx malnutrition at this time.  Labs reviewed:  CBGs 190, 173, 180 Na 153, K+ 2.8, TBili 1.3,  Medications reviewed and include:  Insulin NS 20 K+ at 16500mL/hr   NUTRITION - FOCUSED PHYSICAL EXAM:    Most Recent Value  Orbital Region  Unable to assess  Upper Arm Region  Unable to assess  Thoracic and Lumbar Region  Unable to assess  Buccal Region  Unable to assess  Temple Region  Unable to assess  Clavicle Bone Region  Unable to assess  Clavicle and Acromion Bone Region  Unable to assess  Scapular Bone Region  Unable to assess  Dorsal Hand  Unable to assess   Patellar Region  Unable to assess  Anterior Thigh Region  Unable to assess  Posterior Calf Region  Unable to assess  Edema (RD Assessment)  Unable to assess  Hair  Unable to assess  Eyes  Unable to assess  Mouth  Unable to assess  Skin  Unable to assess  Nails  Unable to assess       Diet Order:  Diet NPO time specified Fall precautions Aspiration precautions  EDUCATION NEEDS:   Not appropriate for education at this time  Skin:  Skin Assessment: Skin Integrity Issues: Skin Integrity Issues:: Other (Comment) Other: BL MSAD, Abrasions, Ecchymosis to knees, buttocks, arms  Last BM:  PTA  Height:   Ht Readings from Last 1 Encounters:  04/06/2017 5\' 2"  (1.575 m)    Weight:   Wt Readings from Last 1 Encounters:  04/16/2017 233 lb (105.7 kg)    Ideal Body Weight:  50 kg  BMI:  Body mass index is 42.62 kg/m.  Estimated Nutritional Needs:   Kcal:  [id number] calories (MSJ x1.2-1.4)  Protein:  100-125 grams  Fluid:  1.9-2.2L  Dionne AnoWilliam M. Daveyon Kitchings, MS, RD LDN Inpatient Clinical Dietitian Pager [phone]

## 2017-04-22 NOTE — Consult Note (Signed)
Referring Physician: Gouru    Chief Complaint: Falls and AMS  HPI: Sierra Bass is an 67 y.o. female was unable to provide any history due to mental status.  All history provided by family and chart.  Patient has had multiple falls over the past couple of months.  Was seen in the emergency room at the beginning of December and then a couple of weeks later was for falls on each occasion.  Per report of son today he reports that after each fall patient was alert and oriented. He reports that on Saturday came to visit her and she was on the floor. He had to assist her back in the bed. He reports that she was talking and did not appear confused. He checked on her on Wednesday because he has not heard from her and again she was on the floor next to the bed. She seemed confused to him. Again today he went to check on her and she was very confused and was on the floor. Patient has had multiple falls in the past several days prior to admission.   Date last known well: Unable to determine Time last known well: Unable to determine tPA Given: No: Unable to determine LKW  Past Medical History:  Diagnosis Date  . Diabetes mellitus without complication (HCC)   . Hypertension     Past Surgical History:  Procedure Laterality Date  . ABDOMINAL HYSTERECTOMY    . CESAREAN SECTION      Family History  Problem Relation Age of Onset  . Stroke Mother   . Cancer Father    Social History:  reports that she has been smoking cigarettes.  She has been smoking about 1.00 pack per day. she has never used smokeless tobacco. She reports that she uses drugs. Drug: Marijuana. She reports that she does not drink alcohol.  Allergies:  Allergies  Allergen Reactions  . Abilify [Aripiprazole]     Tardive dyskinesia     Medications:  I have reviewed the patient's current medications. Prior to Admission:  Medications Prior to Admission  Medication Sig Dispense Refill Last Dose  . gabapentin (NEURONTIN) 600 MG  tablet Take 600 mg by mouth at bedtime.    Past Week at Unknown time  . lisinopril (PRINIVIL,ZESTRIL) 40 MG tablet Take 1 tablet by mouth daily.   Past Week at unknown  . meloxicam (MOBIC) 15 MG tablet Take 1 tablet by mouth daily.   Past Week at Unknown time  . metFORMIN (GLUCOPHAGE) 1000 MG tablet Take 1 tablet by mouth 2 (two) times daily.   Past Week at Unknown time  . oxybutynin (DITROPAN-XL) 10 MG 24 hr tablet Take 10 mg by mouth 3 (three) times daily.    Past Week at Unknown time  . pramipexole (MIRAPEX) 1.5 MG tablet Take 1 tablet by mouth daily.   Past Week at Unknown time  . simvastatin (ZOCOR) 10 MG tablet Take 1 tablet by mouth daily.   Past Week at Unknown time  . venlafaxine XR (EFFEXOR-XR) 150 MG 24 hr capsule Take 2 capsules by mouth daily.   Past Week at Unknown time   Scheduled: . aspirin  300 mg Rectal Daily   Or  . aspirin EC  325 mg Oral Daily  . heparin  5,000 Units Subcutaneous Q8H  . insulin aspart  0-15 Units Subcutaneous TID WC  . mouth rinse  15 mL Mouth Rinse BID  . oxybutynin  25 mg Oral Daily  . pramipexole  1.5 mg Oral  Daily  . simvastatin  10 mg Oral Daily  . venlafaxine XR  300 mg Oral Daily    ROS: Unable to determine due to mental status  Physical Examination: Blood pressure (!) 132/53, pulse 96, temperature 98.7 F (37.1 C), temperature source Oral, resp. rate 20, height 5\' 2"  (1.575 m), weight 105.7 kg (233 lb), SpO2 94 %.  HEENT-  Hard swollen area at right jaw.  Normal external eye and conjunctiva.  Normal TM's bilaterally.  Normal auditory canals and external ears. Normal external nose, mucus membranes and septum.  Normal pharynx. Cardiovascular- S1, S2 normal, pulses palpable throughout   Lungs- chest clear, no wheezing, rales, normal symmetric air entry.  Using accessory muscles to breathe Abdomen- soft, non-tender; bowel sounds normal; no masses,  no organomegaly Extremities- no edema Lymph-no adenopathy palpable Musculoskeletal-no joint  tenderness, deformity or swelling Skin-warm and dry, no hyperpigmentation, vitiligo, or suspicious lesions  Neurological Examination   Mental Status: Patient does not respond to verbal stimuli.  Does not respond to deep sternal rub.  Does not follow commands.  No verbalizations noted.  Cranial Nerves: II: patient does not respond confrontation bilaterally, pupils right 4 mm, left 4 mm,and reactive bilaterally III,IV,VI: doll's response present bilaterally.  V,VII: corneal reflex present bilaterally  VIII: patient does not respond to verbal stimuli IX,X: gag reflex reduced, XI: trapezius strength unable to test bilaterally XII: tongue strength unable to test Motor: Extremities flaccid throughout.  No spontaneous movement noted.  No purposeful movements noted. Sensory: Does not respond to noxious stimuli in the right upper extremity.  Withdrawal noted to painful stimuli in all other extremities. Deep Tendon Reflexes:  2+ in the upper extremities, absent in the lower extremities. Plantars: upgoing bilaterally Cerebellar: Unable to perform   Laboratory Studies:  Basic Metabolic Panel: Recent Labs  Lab 04/11/2017 1047 03/30/2017 1838  NA 155*  --   K 3.2*  --   CL 113*  --   CO2 25  --   GLUCOSE 253*  --   BUN 55*  --   CREATININE 1.52*  --   CALCIUM 10.1  --   MG  --  2.2    Liver Function Tests: Recent Labs  Lab 04/20/2017 1047  AST 57*  ALT 46  ALKPHOS 88  BILITOT 1.3*  PROT 7.5  ALBUMIN 3.5   Recent Labs  Lab 04/02/2017 1047  LIPASE 17   No results for input(s): AMMONIA in the last 168 hours.  CBC: Recent Labs  Lab 04/04/2017 1047  WBC 13.9*  NEUTROABS 11.9*  HGB 15.3  HCT 47.9*  MCV 83.6  PLT 347    Cardiac Enzymes: Recent Labs  Lab 04/25/2017 1047 04/11/2017 1838 04/05/2017 2040 04/22/17 0243  CKTOTAL 239*  --   --   --   TROPONINI 0.08* 0.07* 0.06* 0.05*    BNP: Invalid input(s): POCBNP  CBG: Recent Labs  Lab 04/03/2017 1723 04/02/2017 2104   GLUCAP 180* 173*    Microbiology: Results for orders placed or performed during the hospital encounter of 04/22/2017  Blood Culture (routine x 2)     Status: None (Preliminary result)   Collection Time: 04/10/2017 10:48 AM  Result Value Ref Range Status   Specimen Description BLOOD RIGHT [facility]  Final   Special Requests   Final    BOTTLES DRAWN AEROBIC AND ANAEROBIC Blood Culture adequate volume   Culture  Setup Time   Final    Organism ID to follow GRAM POSITIVE COCCI AEROBIC BOTTLE ONLY CRITICAL  RESULT CALLED TO, READ BACK BY AND VERIFIED WITH: MATT MCBANE AT 0322 ON 04/22/17 [facility]. Performed at [facility], [address] [place], [place] [address]    Culture [facility]  Final   Report Status PENDING  Incomplete  Blood Culture (routine x 2)     Status: None (Preliminary result)   Collection Time: 10-01-2017 10:48 AM  Result Value Ref Range Status   Specimen Description BLOOD Blood Culture adequate volume  Final   Special Requests BLOOD RIGHT HAND  Final   Culture  Setup Time   Final    GRAM POSITIVE COCCI ANAEROBIC BOTTLE ONLY CRITICAL VALUE NOTED.  VALUE IS CONSISTENT WITH PREVIOUSLY REPORTED AND CALLED VALUE. Performed at [facility], [address] [place], [place] [address]    [facility]  Final   Report Status PENDING  Incomplete  Blood Culture ID Panel (Reflexed)     Status: Abnormal   Collection Time: 10-01-2017 10:48 AM  Result Value Ref Range Status   Enterococcus species NOT DETECTED NOT DETECTED Final   Vancomycin resistance NOT DETECTED NOT DETECTED Final   Listeria monocytogenes NOT DETECTED NOT DETECTED Final   Staphylococcus species DETECTED (A) NOT DETECTED Final    Comment: CRITICAL RESULT CALLED TO, READ BACK BY AND VERIFIED WITH: MATT MCBANE AT 0322 ON 04/22/17 [facility].    Staphylococcus aureus DETECTED (A) NOT DETECTED Final    Comment: Methicillin (oxacillin) susceptible Staphylococcus aureus (MSSA). Preferred therapy  is anti staphylococcal beta lactam antibiotic (Cefazolin or Nafcillin), unless clinically contraindicated. CRITICAL RESULT CALLED TO, READ BACK BY AND VERIFIED WITH: MATT MCBANE AT 0322 ON 04/22/17 [facility].    Methicillin resistance NOT DETECTED NOT DETECTED Final   Streptococcus species NOT DETECTED NOT DETECTED Final   Streptococcus agalactiae NOT DETECTED NOT DETECTED Final   Streptococcus pneumoniae NOT DETECTED NOT DETECTED Final   Streptococcus pyogenes NOT DETECTED NOT DETECTED Final   Acinetobacter baumannii NOT DETECTED NOT DETECTED Final   Enterobacteriaceae species NOT DETECTED NOT DETECTED Final   Enterobacter cloacae complex NOT DETECTED NOT DETECTED Final   Escherichia coli NOT DETECTED NOT DETECTED Final   Klebsiella oxytoca NOT DETECTED NOT DETECTED Final   Klebsiella pneumoniae NOT DETECTED NOT DETECTED Final   Proteus species NOT DETECTED NOT DETECTED Final   Serratia marcescens NOT DETECTED NOT DETECTED Final   Carbapenem resistance NOT DETECTED NOT DETECTED Final   Haemophilus influenzae NOT DETECTED NOT DETECTED Final   Neisseria meningitidis NOT DETECTED NOT DETECTED Final   Pseudomonas aeruginosa NOT DETECTED NOT DETECTED Final   Candida albicans NOT DETECTED NOT DETECTED Final   Candida glabrata NOT DETECTED NOT DETECTED Final   Candida krusei NOT DETECTED NOT DETECTED Final   Candida parapsilosis NOT DETECTED NOT DETECTED Final   Candida tropicalis NOT DETECTED NOT DETECTED Final    Comment: Performed at [facility], [address] [place], [place] [address]  MRSA PCR Screening     Status: None   Collection Time: 10-01-2017  5:30 PM  Result Value Ref Range Status   MRSA by PCR NEGATIVE NEGATIVE Final    Comment:        The GeneXpert MRSA Assay (FDA approved for NASAL specimens only), is one component of a comprehensive MRSA colonization surveillance program. It is not intended to diagnose MRSA infection nor to guide or monitor treatment for MRSA  infections. Performed at [facility], [address] [place], [place] [address]     Coagulation Studies: No  results for input(s): LABPROT, INR in the last 72 hours.  Urinalysis:  Recent Labs  Lab 04/20/2017 1048  COLORURINE AMBER*  LABSPEC 1.019  PHURINE 5.0  GLUCOSEU NEGATIVE  HGBUR MODERATE*  BILIRUBINUR NEGATIVE  KETONESUR 5*  PROTEINUR 100*  NITRITE NEGATIVE  LEUKOCYTESUR SMALL*    Lipid Panel:    Component Value Date/Time   CHOL 111 04/22/2017 0243   TRIG 129 04/22/2017 0243   HDL 26 (L) 04/22/2017 0243   CHOLHDL 4.3 04/22/2017 0243   VLDL 26 04/22/2017 0243   LDLCALC 59 04/22/2017 0243    HgbA1C:  Lab Results  Component Value Date   HGBA1C 5.9 (H) 04/22/2017    Urine Drug Screen:      Component Value Date/Time   LABOPIA NONE DETECTED 03/06/2017 1721   COCAINSCRNUR NONE DETECTED 03/06/2017 1721   LABBENZ NONE DETECTED 03/06/2017 1721   AMPHETMU NONE DETECTED 03/06/2017 1721   THCU NONE DETECTED 03/06/2017 1721   LABBARB NONE DETECTED 03/06/2017 1721    Alcohol Level: No results for input(s): ETH in the last 168 hours.   Imaging: Ct Head Wo Contrast  Result Date: 04/04/2017 CLINICAL DATA:  Fall EXAM: CT HEAD WITHOUT CONTRAST CT CERVICAL SPINE WITHOUT CONTRAST TECHNIQUE: Multidetector CT imaging of the head and cervical spine was performed following the standard protocol without intravenous contrast. Multiplanar CT image reconstructions of the cervical spine were also generated. COMPARISON:  None. FINDINGS: CT HEAD FINDINGS Brain: Apparent low-density area within the inferior right cerebellar hemisphere. This area is difficult to evaluate by CT, but cannot exclude acute to subacute infarction. No hemorrhage or hydrocephalus. No midline shift. Vascular: No hyperdense vessel or unexpected calcification. Skull: No acute calvarial abnormality. Sinuses/Orbits: Visualized paranasal sinuses and mastoids clear. Orbital soft tissues unremarkable. Other:  Soft tissue swelling in the right lateral scalp. CT CERVICAL SPINE FINDINGS Alignment: Normal Skull base and vertebrae: No fracture Soft tissues and spinal canal: Prevertebral soft tissues are normal. No epidural or paraspinal hematoma. Disc levels: Degenerative disc disease from C5-6 through C7-T1. Disc space narrowing and spurring. Upper chest: Negative Other: No acute findings.  Carotid artery calcifications. IMPRESSION: Apparent low-density in the inferior right cerebellar hemisphere. This area is difficult to evaluate by CT and could be artifactual, but cannot exclude acute to subacute right cerebellar infarct. No evidence of hemorrhage. No acute bony abnormality in the cervical spine. Electronically Signed   By: Charlett Nose M.D.   On: 04/09/2017 11:17   Ct Cervical Spine Wo Contrast  Result Date: 04/12/2017 CLINICAL DATA:  Fall EXAM: CT HEAD WITHOUT CONTRAST CT CERVICAL SPINE WITHOUT CONTRAST TECHNIQUE: Multidetector CT imaging of the head and cervical spine was performed following the standard protocol without intravenous contrast. Multiplanar CT image reconstructions of the cervical spine were also generated. COMPARISON:  None. FINDINGS: CT HEAD FINDINGS Brain: Apparent low-density area within the inferior right cerebellar hemisphere. This area is difficult to evaluate by CT, but cannot exclude acute to subacute infarction. No hemorrhage or hydrocephalus. No midline shift. Vascular: No hyperdense vessel or unexpected calcification. Skull: No acute calvarial abnormality. Sinuses/Orbits: Visualized paranasal sinuses and mastoids clear. Orbital soft tissues unremarkable. Other: Soft tissue swelling in the right lateral scalp. CT CERVICAL SPINE FINDINGS Alignment: Normal Skull base and vertebrae: No fracture Soft tissues and spinal canal: Prevertebral soft tissues are normal. No epidural or paraspinal hematoma. Disc levels: Degenerative disc disease from C5-6 through C7-T1. Disc space narrowing and  spurring. Upper chest: Negative Other: No acute findings.  Carotid artery calcifications. IMPRESSION:  Apparent low-density in the inferior right cerebellar hemisphere. This area is difficult to evaluate by CT and could be artifactual, but cannot exclude acute to subacute right cerebellar infarct. No evidence of hemorrhage. No acute bony abnormality in the cervical spine. Electronically Signed   By: Charlett Nose M.D.   On: 04/09/2017 11:17   Mr Brain Wo Contrast  Result Date: 04/22/2017 CLINICAL DATA:  Initial evaluation for acute altered mental status, recent falls. Question possible stroke on previous CT. EXAM: MRI HEAD WITHOUT CONTRAST MRA HEAD WITHOUT CONTRAST TECHNIQUE: Multiplanar, multiecho pulse sequences of the brain and surrounding structures were obtained without intravenous contrast. Angiographic images of the head were obtained using MRA technique without contrast. COMPARISON:  Prior CT from 04/20/2017. FINDINGS: MRI HEAD FINDINGS Brain: Study mildly degraded by motion artifact. Mild diffuse age-related cerebral atrophy. Patchy T2/FLAIR hyperintensity within the periventricular white matter most consistent with chronic microvascular ischemic disease, mild for age. There is abnormal restricted diffusion involving the cortical gray matter and underlying subcortical and deep white matter of the bilateral frontal and parietal lobes, left greater than right (series 100, image 39) this extends in a somewhat linear fashion, suggesting watershed type infarcts. Additional patchy areas of infarction present within the bilateral occipital lobes (series 100, image 30, 24). No associated hemorrhage or mass effect. No other areas of acute or subacute infarction. No acute or chronic intracranial hemorrhage. No mass lesion, midline shift, or mass effect. No hydrocephalus. No extra-axial fluid collection. Major dural sinuses are grossly patent. Pituitary gland suprasellar region normal. Midline structures intact and  normal. Vascular: Major intracranial vascular flow voids are maintained. Skull and upper cervical spine: Craniocervical junction within normal limits. Bone marrow signal intensity normal. No scalp soft tissue abnormality. Sinuses/Orbits: Globes and orbital soft tissues within normal limits. Mild scattered mucosal thickening within the ethmoidal air cells. Paranasal sinuses are otherwise clear. Small left mastoid effusion noted, of doubtful significance. Inner ear structures normal. Other: None. MRA HEAD FINDINGS ANTERIOR CIRCULATION: Study moderately degraded by motion artifact, limiting evaluation of the mid and distal vasculature. Distal cervical segments of the internal carotid arteries patent at the skull base. Petrous, cavernous, and supraclinoid segments patent without flow-limiting stenosis. ICA termini widely patent. A1 segments patent bilaterally. Grossly normal anterior communicating artery. Anterior cerebral arteries grossly patent to their distal aspects, although limited evaluation due to motion. Moderate atheromatous irregularity involving the ACA is suspected. M1 segments patent without obvious stenosis. Normal MCA bifurcations. No appreciable proximal M2 occlusion. Distal MCA branches not well evaluated on this motion degraded exam, but are grossly patent. POSTERIOR CIRCULATION: Vertebral arteries patent to the vertebrobasilar junction. Basilar artery patent to its distal aspect without flow-limiting stenosis. Superior cerebral arteries patent proximally. Left PCA supplied primarily via the basilar. Hypoplastic right P1 segment with prominent right posterior communicating artery. PCAs demonstrate moderate atheromatous irregularity with moderate to severe multifocal bilateral P2 stenoses. No appreciable aneurysm. IMPRESSION: MRI HEAD IMPRESSION: 1. 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 2. Mild age-related cerebral atrophy with chronic small vessel ischemic disease. MRA HEAD IMPRESSION: 1. Motion degraded exam. 2. No large or proximal arterial branch occlusion. No proximal or high-grade correctable stenosis identified. 3. Mild to moderate intracranial atherosclerotic change, most notable within the PCAs bilaterally. Electronically Signed   By: Rise Mu M.D.   On: 04/22/2017 01:50   US Carotid Bilateral (at [facility] And [facility] Only)  Result Date: 04/22/2017  CLINICAL DATA:  CVA EXAM: BILATERAL CAROTID DUPLEX ULTRASOUND TECHNIQUE: Wallace Cullens scale imaging, color Doppler and duplex ultrasound were performed of bilateral carotid and vertebral arteries in the neck. COMPARISON:  None. FINDINGS: Criteria: Quantification of carotid stenosis is based on velocity parameters that correlate the residual internal carotid diameter with NASCET-based stenosis levels, using the diameter of the distal internal carotid lumen as the denominator for stenosis measurement. The following velocity measurements were obtained: RIGHT ICA:  95 cm/sec CCA:  142 cm/sec SYSTOLIC ICA/CCA RATIO:  0.7 DIASTOLIC ICA/CCA RATIO:  1.9 ECA:  249 cm/sec LEFT ICA:  107 cm/sec CCA:  134 cm/sec SYSTOLIC ICA/CCA RATIO:  0.8 DIASTOLIC ICA/CCA RATIO:  2.2 ECA:  173 cm/sec RIGHT CAROTID ARTERY: Mild calcified plaque in the bulb. Low resistance internal carotid Doppler pattern. RIGHT VERTEBRAL ARTERY:  Antegrade. LEFT CAROTID ARTERY: Moderate irregular calcified plaque in the bulb. Low resistance internal carotid Doppler pattern is preserved. Calcified plaque does extend into the internal carotid artery. LEFT VERTEBRAL ARTERY:  Antegrade. IMPRESSION: Less than 50% stenosis in the right and left internal carotid arteries. Electronically Signed   By: Jolaine Click M.D.   On: 04/22/2017 08:59   Dg Chest Port 1 View  Result Date: 04/01/2017 CLINICAL DATA:  Suspect sepsis. EXAM: PORTABLE CHEST 1 VIEW COMPARISON:  None.  FINDINGS: The heart size and mediastinal contours are within normal limits. Both lungs are clear. The visualized skeletal structures are unremarkable. IMPRESSION: No active disease. Electronically Signed   By: Signa Kell M.D.   On: 04/20/2017 11:27   Mr Maxine Glenn Head/brain ZO Cm  Result Date: 04/22/2017 CLINICAL DATA:  Initial evaluation for acute altered mental status, recent falls. Question possible stroke on previous CT. EXAM: MRI HEAD WITHOUT CONTRAST MRA HEAD WITHOUT CONTRAST TECHNIQUE: Multiplanar, multiecho pulse sequences of the brain and surrounding structures were obtained without intravenous contrast. Angiographic images of the head were obtained using MRA technique without contrast. COMPARISON:  Prior CT from 04/01/2017. FINDINGS: MRI HEAD FINDINGS Brain: Study mildly degraded by motion artifact. Mild diffuse age-related cerebral atrophy. Patchy T2/FLAIR hyperintensity within the periventricular white matter most consistent with chronic microvascular ischemic disease, mild for age. There is abnormal restricted diffusion involving the cortical gray matter and underlying subcortical and deep white matter of the bilateral frontal and parietal lobes, left greater than right (series 100, image 39) this extends in a somewhat linear fashion, suggesting watershed type infarcts. Additional patchy areas of infarction present within the bilateral occipital lobes (series 100, image 30, 24). No associated hemorrhage or mass effect. No other areas of acute or subacute infarction. No acute or chronic intracranial hemorrhage. No mass lesion, midline shift, or mass effect. No hydrocephalus. No extra-axial fluid collection. Major dural sinuses are grossly patent. Pituitary gland suprasellar region normal. Midline structures intact and normal. Vascular: Major intracranial vascular flow voids are maintained. Skull and upper cervical spine: Craniocervical junction within normal limits. Bone marrow signal intensity normal.  No scalp soft tissue abnormality. Sinuses/Orbits: Globes and orbital soft tissues within normal limits. Mild scattered mucosal thickening within the ethmoidal air cells. Paranasal sinuses are otherwise clear. Small left mastoid effusion noted, of doubtful significance. Inner ear structures normal. Other: None. MRA HEAD FINDINGS ANTERIOR CIRCULATION: Study moderately degraded by motion artifact, limiting evaluation of the mid and distal vasculature. Distal cervical segments of the internal carotid arteries patent at the skull base. Petrous, cavernous, and supraclinoid segments patent without flow-limiting stenosis. ICA termini widely patent. A1 segments patent bilaterally. Grossly normal anterior communicating artery. Anterior  cerebral arteries grossly patent to their distal aspects, although limited evaluation due to motion. Moderate atheromatous irregularity involving the ACA is suspected. M1 segments patent without obvious stenosis. Normal MCA bifurcations. No appreciable proximal M2 occlusion. Distal MCA branches not well evaluated on this motion degraded exam, but are grossly patent. POSTERIOR CIRCULATION: Vertebral arteries patent to the vertebrobasilar junction. Basilar artery patent to its distal aspect without flow-limiting stenosis. Superior cerebral arteries patent proximally. Left PCA supplied primarily via the basilar. Hypoplastic right P1 segment with prominent right posterior communicating artery. PCAs demonstrate moderate atheromatous irregularity with moderate to severe multifocal bilateral P2 stenoses. No appreciable aneurysm. IMPRESSION: MRI HEAD IMPRESSION: 1. 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 2. Mild age-related cerebral atrophy with chronic small vessel ischemic disease. MRA HEAD IMPRESSION: 1. Motion degraded exam. 2. No large or proximal arterial branch  occlusion. No proximal or high-grade correctable stenosis identified. 3. Mild to moderate intracranial atherosclerotic change, most notable within the PCAs bilaterally. Electronically Signed   By: Rise Mu M.D.   On: 04/22/2017 01:50    Assessment: 67 y.o. female presenting after having had multiple falls with altered mental status.  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  Patient on no antiplatelet therapy prior to admission.  Carotid dopplers show no evidence of hemodynamically significant stenosis.  Echocardiogram pending.  A1c 5.9, LDL 59.  Stroke Risk Factors - diabetes mellitus, hypertension and smoking  Plan: 1. PT consult, OT consult, Speech consult 2. Echocardiogram pending 3. Prophylactic therapy-aspirin 300 mg rectally, daily 4. NPO until RN stroke swallow screen 5. Telemetry monitoring 6. Frequent neuro checks 7. Smoking cessation 8. From review of admission information it appears that mental status has continued to decline.  Would repeat head CT of the brain without contrast to rule out hemorrhagic transformation of infarcts.  Otherwise suspect this decline may be due to edema. 9.  Patient appears to have increased work of breathing.  ABG to be done today.   Thana Farr, MD Neurology [phone] 04/22/2017, 11:31 AM

## 2017-04-22 NOTE — Progress Notes (Signed)
PHARMACY - PHYSICIAN COMMUNICATION CRITICAL VALUE ALERT - BLOOD CULTURE IDENTIFICATION (BCID)  Sierra AmosCelia Bass is an 67 y.o. 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 (include suspected source if known)  Name of physician (or Provider) Contacted: Sheryle Hailiamond  Current antibiotics: Zosyn  Changes to prescribed antibiotics recommended:  n/a  Results for orders placed or performed during the hospital encounter of 03/31/2017  Blood Culture ID Panel (Reflexed) (Collected: 04/20/2017 10:48 AM)  Result Value Ref Range   Enterococcus species NOT DETECTED NOT DETECTED   Vancomycin resistance NOT DETECTED NOT DETECTED   Listeria monocytogenes NOT DETECTED NOT DETECTED   Staphylococcus species DETECTED (A) NOT DETECTED   Staphylococcus aureus DETECTED (A) NOT DETECTED   Methicillin resistance NOT DETECTED NOT DETECTED   Streptococcus species NOT DETECTED NOT DETECTED   Streptococcus agalactiae NOT DETECTED NOT DETECTED   Streptococcus pneumoniae NOT DETECTED NOT DETECTED   Streptococcus pyogenes NOT DETECTED NOT DETECTED   Acinetobacter baumannii NOT DETECTED NOT DETECTED   Enterobacteriaceae species NOT DETECTED NOT DETECTED   Enterobacter cloacae complex NOT DETECTED NOT DETECTED   Escherichia coli NOT DETECTED NOT DETECTED   Klebsiella oxytoca NOT DETECTED NOT DETECTED   Klebsiella pneumoniae NOT DETECTED NOT DETECTED   Proteus species NOT DETECTED NOT DETECTED   Serratia marcescens NOT DETECTED NOT DETECTED   Carbapenem resistance NOT DETECTED NOT DETECTED   Haemophilus influenzae NOT DETECTED NOT DETECTED   Neisseria meningitidis NOT DETECTED NOT DETECTED   Pseudomonas aeruginosa NOT DETECTED NOT DETECTED   Candida albicans NOT DETECTED NOT DETECTED   Candida glabrata NOT DETECTED NOT DETECTED   Candida krusei NOT DETECTED NOT DETECTED   Candida parapsilosis NOT DETECTED NOT DETECTED   Candida tropicalis NOT DETECTED NOT  DETECTED    [name] 04/22/2017  4:22 AM

## 2017-04-22 NOTE — Consult Note (Signed)
Pharmacy Antibiotic Note  Sierra AmosCelia Bass is a 67 y.o. female admitted on 04/25/2017 with bacteremia, UTI and possible endocarditis.  Pharmacy has been consulted for cefazolin dosing.  Plan: cefazolin 2g q 8 hours  Height: 5\' 2"  (157.5 cm) Weight: 233 lb (105.7 kg) IBW/kg (Calculated) : 50.1  Temp (24hrs), Avg:98.3 F (36.8 C), Min:97.6 F (36.4 C), Max:98.7 F (37.1 C)  Recent Labs  Lab 04/16/2017 1047 04/25/2017 1501 04/22/17 1125 04/22/17 1249  WBC 13.9*  --  7.5  --   CREATININE 1.52*  --  0.89  --   LATICACIDVEN 4.0* 3.1*  --  1.8    Estimated Creatinine Clearance: 71.9 mL/min (by C-G formula based on SCr of 0.89 mg/dL).    Allergies  Allergen Reactions  . Abilify [Aripiprazole]     Tardive dyskinesia     Antimicrobials this admission: Piperacillin/tazobactam 1/24 >> 1/25 vancomycin 1/25 >> 1/25 Cefazolin 1/25>>  Dose adjustments this admission:   Microbiology results: 1/24 BCx: MSSA 1/24 UCx: >100K ecoli 1/24 MRSA PCR: Negative    Thank you for allowing pharmacy to be a part of this patient's care.  Olene FlossMelissa D Leeloo Silverthorne 04/22/2017 5:07 PM

## 2017-04-22 NOTE — Progress Notes (Signed)
Pharmacy Antibiotic Note  Sierra Bass is a 67 y.o. female admitted on 04/02/2017 with sepsis.  Pharmacy has been consulted for vancomycin + piperacillin/tazobactam dosing.  Patient has MSSA bacteremia, ID has been consulted. Vancomycin being started this evening by hospitalist.  Plan: Continue piperacillin/tazobactam 3.375 g IV q8h EI  Vancomycin 1000 mg IV once followed by vancomycin 1000 mg IV q12h (6 hour stack) Goal VT 15-20 mcg/mL  Kinetics: Adjusted body weight = 73 kg Ke: 0.063 Half-life: 11 hrs Vd: 51 L Cmin (estimated) ~18 mcg/mL  Height: 5\' 2"  (157.5 cm) Weight: 233 lb (105.7 kg) IBW/kg (Calculated) : 50.1  Temp (24hrs), Avg:98.2 F (36.8 C), Min:97.4 F (36.3 C), Max:98.7 F (37.1 C)  Recent Labs  Lab 04/03/2017 1047 04/25/2017 1501 04/22/17 1125  WBC 13.9*  --  7.5  CREATININE 1.52*  --  0.89  LATICACIDVEN 4.0* 3.1*  --     Estimated Creatinine Clearance: 71.9 mL/min (by C-G formula based on SCr of 0.89 mg/dL).    Allergies  Allergen Reactions  . Abilify [Aripiprazole]     Tardive dyskinesia    Antimicrobials this admission: Piperacillin/tazobactam 1/24 >>  vancomycin 1/25 >>   Dose adjustments this admission:  Microbiology results: 1/24 BCx: MSSA 1/24 UCx: >100K GNR  1/24 MRSA PCR: Negative  Thank you for allowing pharmacy to be a part of this patient's care.  Sierra Bass , PharmD, BCPS Clinical Pharmacist 04/22/2017 1:29 PM

## 2017-04-22 NOTE — Progress Notes (Signed)
*  PRELIMINARY RESULTS* Echocardiogram 2D Echocardiogram has been performed.  Cristela BlueHege, Amelie Caracci 04/22/2017, 9:19 AM

## 2017-04-22 NOTE — Consult Note (Signed)
[facility] Infectious Disease     Reason for Consult: Staph aureus bacteremia   Referring Physician: Margaretmary Eddy, A Date of Admission:  04/18/2017   Active Problems:   Sepsis ([place])   HPI: Clair Bardwell is a 67 y.o. female admitted with falls and confusion progressive over several days.. On admit wbc 13, temp 99. CT MRI with scattered acute strokes.  Na elevated, ARF. UA 6-30 wbc, UCX E coli and BCX MSSA. Also developed R facial swelling and found to have parotitis and had cx done by ENT.  She remains quite ill, confused.  Past Medical History:  Diagnosis Date  . Diabetes mellitus without complication ([place])   . Hypertension    Past Surgical History:  Procedure Laterality Date  . ABDOMINAL HYSTERECTOMY    . CESAREAN SECTION     Social History   Tobacco Use  . Smoking status: Current Every Day Smoker    Packs/day: 1.00    Types: Cigarettes  . Smokeless tobacco: Never Used  Substance Use Topics  . Alcohol use: No    Frequency: Never  . Drug use: Yes    Types: Marijuana    Comment: Per family 6 months ago   Family History  Problem Relation Age of Onset  . Stroke Mother   . Cancer Father     Allergies:  Allergies  Allergen Reactions  . Abilify [Aripiprazole]     Tardive dyskinesia     Current antibiotics: Antibiotics Given (last 72 hours)    Date/Time Action Medication Dose Rate   04/01/2017 1121 New Bag/Given   piperacillin-tazobactam (ZOSYN) IVPB 3.375 g 3.375 g 100 mL/hr   04/20/2017 1121 New Bag/Given   vancomycin (VANCOCIN) IVPB 1000 mg/200 mL premix 1,000 mg 200 mL/hr   04/24/2017 1618 New Bag/Given   piperacillin-tazobactam (ZOSYN) IVPB 3.375 g 3.375 g 12.5 mL/hr   04/22/17 0330 New Bag/Given   piperacillin-tazobactam (ZOSYN) IVPB 3.375 g 3.375 g 12.5 mL/hr   04/22/17 0900 New Bag/Given   piperacillin-tazobactam (ZOSYN) IVPB 3.375 g 3.375 g 12.5 mL/hr      MEDICATIONS: . aspirin  300 mg Rectal Daily   Or  . aspirin EC  325 mg Oral Daily  . heparin  5,000  Units Subcutaneous Q8H  . insulin aspart  0-15 Units Subcutaneous TID WC  . insulin aspart  0-5 Units Subcutaneous QHS  . mouth rinse  15 mL Mouth Rinse BID  . oxybutynin  25 mg Oral Daily  . pramipexole  1.5 mg Oral Daily  . simvastatin  10 mg Oral Daily  . venlafaxine XR  300 mg Oral Daily    Review of Systems - 11 systems reviewed and negative per HPI   OBJECTIVE: Temp:  [97.6 F (36.4 C)-98.7 F (37.1 C)] 97.6 F (36.4 C) (01/25 1100) Pulse Rate:  [80-108] 80 (01/25 1503) Resp:  [18-24] 24 (01/25 1100) BP: (104-163)/(41-125) 148/52 (01/25 1503) SpO2:  [94 %-100 %] 97 % (01/25 1503) Physical Exam  Constitutional:  , lethargic, unresponsive   HENT: R parotid swelling  no scleral icterus Mouth/Throat: Oropharynx is clear and moist. No oropharyngeal exudate.  Cardiovascular: Normal rate, regular rhythm and normal heart sounds. 2/6 sm Pulmonary/Chest:poor air movment  Neck = supple, no nuchal rigidity Abdominal: Soft. Bowel sounds are normal.  exhibits no distension. There is no tenderness.  Lymphadenopathy: no cervical adenopathy. No axillary adenopathy Neurological: unarouseable,  Skin: Skin is warm and dry. No rash noted. No erythema.  Psychiatric: unable to assess  LABS: Results for orders placed  or performed during the hospital encounter of 04/15/2017 (from the past 48 hour(s))  Lactic acid, plasma     Status: Abnormal   Collection Time: 03/29/2017 10:47 AM  Result Value Ref Range   Lactic Acid, Venous 4.0 (HH) 0.5 - 1.9 mmol/L    Comment: CRITICAL RESULT CALLED TO, READ BACK BY AND VERIFIED WITH KELLY PENDLETON AT 1130 04/06/2017 DAS Performed at [facility], [address] [place], [place] [address]   Comprehensive metabolic panel     Status: Abnormal   Collection Time: 04/11/2017 10:47 AM  Result Value Ref Range   Sodium 155 (H) 135 - 145 mmol/L   Potassium 3.2 (L) 3.5 - 5.1 mmol/L   Chloride 113 (H) 101 - 111 mmol/L   CO2 25 22 - 32 mmol/L   Glucose, Bld  253 (H) 65 - 99 mg/dL   BUN 55 (H) 6 - 20 mg/dL   Creatinine, Ser 1.52 (H) 0.44 - 1.00 mg/dL   Calcium 10.1 8.9 - 10.3 mg/dL   Total Protein 7.5 6.5 - 8.1 g/dL   Albumin 3.5 3.5 - 5.0 g/dL   AST 57 (H) 15 - 41 U/L   ALT 46 14 - 54 U/L   Alkaline Phosphatase 88 38 - 126 U/L   Total Bilirubin 1.3 (H) 0.3 - 1.2 mg/dL   GFR calc non Af Amer 35 (L) >60 mL/min   GFR calc Af Amer 40 (L) >60 mL/min    Comment: (NOTE) The eGFR has been calculated using the CKD EPI equation. This calculation has not been validated in all clinical situations. eGFR's persistently <60 mL/min signify possible Chronic Kidney Disease.    Anion gap 17 (H) 5 - 15    Comment: Performed at [facility], [address] [place], [place] [address]  Lipase, blood     Status: None   Collection Time: 04/24/2017 10:47 AM  Result Value Ref Range   Lipase 17 11 - 51 U/L    Comment: Performed at [facility], [address] [place], [place] [address]  Troponin I     Status: Abnormal   Collection Time: 04/18/2017 10:47 AM  Result Value Ref Range   Troponin I 0.08 (HH) <0.03 ng/mL    Comment: CRITICAL RESULT CALLED TO, READ BACK BY AND VERIFIED WITH KELLY PENDLETON AT 1130 04/20/2017 DAS Performed at [facility], [address] [place], [place] [address]   CBC WITH DIFFERENTIAL     Status: Abnormal   Collection Time: 04/14/2017 10:47 AM  Result Value Ref Range   WBC 13.9 (H) 3.6 - 11.0 K/uL   RBC 5.73 (H) 3.80 - 5.20 MIL/uL   Hemoglobin 15.3 12.0 - 16.0 g/dL   HCT 47.9 (H) 35.0 - 47.0 %   MCV 83.6 80.0 - 100.0 fL   MCH 26.7 26.0 - 34.0 pg   MCHC 31.9 (L) 32.0 - 36.0 g/dL   RDW 16.2 (H) 11.5 - 14.5 %   Platelets 347 150 - 440 K/uL   Neutrophils Relative % 86 %   Neutro Abs 11.9 (H) 1.4 - 6.5 K/uL   Lymphocytes Relative 5 %   Lymphs Abs 0.8 (L) 1.0 - 3.6 K/uL   Monocytes Relative 9 %   Monocytes Absolute 1.3 (H) 0.2 - 0.9 K/uL   Eosinophils Relative 0 %   Eosinophils Absolute 0.0 0 - 0.7  K/uL   Basophils Relative 0 %   Basophils Absolute 0.0 0 - 0.1 K/uL    Comment: Performed at  [facility], [address] [place], [place] [address]  APTT     Status: None   Collection Time: 04/24/2017 10:47 AM  Result Value Ref Range   aPTT 26 24 - 36 seconds    Comment: Performed at [facility], [address] [place], [place] [address]  CK     Status: Abnormal   Collection Time: 04/22/2017 10:47 AM  Result Value Ref Range   Total CK 239 (H) 38 - 234 U/L    Comment: Performed at [facility], [address] [place], [place] [address]  Blood Culture (routine x 2)     Status: None (Preliminary result)   Collection Time: 04/12/2017 10:48 AM  Result Value Ref Range   Specimen Description BLOOD RIGHT AC    Special Requests      BOTTLES DRAWN AEROBIC AND ANAEROBIC Blood Culture adequate volume   Culture  Setup Time      GRAM POSITIVE COCCI IN BOTH AEROBIC AND ANAEROBIC BOTTLES CRITICAL RESULT CALLED TO, READ BACK BY AND VERIFIED WITH: MATT MCBANE AT 6644 ON 04/22/17 [place]. Performed at [facility], [address] [place], [place] [address]    Culture GRAM POSITIVE COCCI    Report Status PENDING   Blood Culture (routine x 2)     Status: None (Preliminary result)   Collection Time: 04/24/2017 10:48 AM  Result Value Ref Range   Specimen Description BLOOD Blood Culture adequate volume    Special Requests BLOOD RIGHT HAND    Culture  Setup Time      GRAM POSITIVE COCCI ANAEROBIC BOTTLE ONLY CRITICAL VALUE NOTED.  VALUE IS CONSISTENT WITH PREVIOUSLY REPORTED AND CALLED VALUE. Performed at [facility], [address] [place], [place] [address]    Culture GRAM POSITIVE COCCI    Report Status PENDING   Urinalysis, Routine w reflex microscopic     Status: Abnormal   Collection Time: 03/31/2017 10:48 AM  Result Value Ref Range   Color, Urine AMBER (A) YELLOW    Comment: BIOCHEMICALS MAY BE AFFECTED BY COLOR   APPearance TURBID (A) CLEAR    Specific Gravity, Urine 1.019 1.005 - 1.030   pH 5.0 5.0 - 8.0   Glucose, UA NEGATIVE NEGATIVE mg/dL   Hgb urine dipstick MODERATE (A) NEGATIVE   Bilirubin Urine NEGATIVE NEGATIVE   Ketones, ur 5 (A) NEGATIVE mg/dL   Protein, ur 100 (A) NEGATIVE mg/dL   Nitrite NEGATIVE NEGATIVE   Leukocytes, UA SMALL (A) NEGATIVE   RBC / HPF 0-5 0 - 5 RBC/hpf   WBC, UA 6-30 0 - 5 WBC/hpf   Bacteria, UA MANY (A) NONE SEEN   Squamous Epithelial / LPF 6-30 (A) NONE SEEN   Mucus PRESENT     Comment: Performed at [facility], [address] [place], [place] [address]  Blood Culture ID Panel (Reflexed)     Status: Abnormal   Collection Time: 04/20/2017 10:48 AM  Result Value Ref Range   Enterococcus species NOT DETECTED NOT DETECTED   Vancomycin resistance NOT DETECTED NOT DETECTED   Listeria monocytogenes NOT DETECTED NOT DETECTED   Staphylococcus species DETECTED (A) NOT DETECTED    Comment: CRITICAL RESULT CALLED TO, READ BACK BY AND VERIFIED WITH: MATT MCBANE AT 0322 ON 04/22/17 [place].    Staphylococcus aureus DETECTED (A) NOT DETECTED    Comment: Methicillin (oxacillin) susceptible Staphylococcus aureus (MSSA). Preferred therapy is anti staphylococcal beta lactam antibiotic (Cefazolin or Nafcillin), unless clinically contraindicated. CRITICAL RESULT CALLED TO,  READ BACK BY AND VERIFIED WITH: MATT MCBANE AT 2426 ON 04/22/17 [place].    Methicillin resistance NOT DETECTED NOT DETECTED   Streptococcus species NOT DETECTED NOT DETECTED   Streptococcus agalactiae NOT DETECTED NOT DETECTED   Streptococcus pneumoniae NOT DETECTED NOT DETECTED   Streptococcus pyogenes NOT DETECTED NOT DETECTED   Acinetobacter baumannii NOT DETECTED NOT DETECTED   Enterobacteriaceae species NOT DETECTED NOT DETECTED   Enterobacter cloacae complex NOT DETECTED NOT DETECTED   Escherichia coli NOT DETECTED NOT DETECTED   Klebsiella oxytoca NOT DETECTED NOT DETECTED   Klebsiella pneumoniae NOT DETECTED NOT DETECTED    Proteus species NOT DETECTED NOT DETECTED   Serratia marcescens NOT DETECTED NOT DETECTED   Carbapenem resistance NOT DETECTED NOT DETECTED   Haemophilus influenzae NOT DETECTED NOT DETECTED   Neisseria meningitidis NOT DETECTED NOT DETECTED   Pseudomonas aeruginosa NOT DETECTED NOT DETECTED   Candida albicans NOT DETECTED NOT DETECTED   Candida glabrata NOT DETECTED NOT DETECTED   Candida krusei NOT DETECTED NOT DETECTED   Candida parapsilosis NOT DETECTED NOT DETECTED   Candida tropicalis NOT DETECTED NOT DETECTED    Comment: Performed at [facility], [address] [place], [place] [address]  Blood gas, venous ([facility], [facility])     Status: Abnormal (Preliminary result)   Collection Time: 04/23/2017 10:49 AM  Result Value Ref Range   pH, Ven 7.40 7.250 - 7.430   pCO2, Ven 43 (L) 44.0 - 60.0 mmHg   pO2, Ven PENDING 32.0 - 45.0 mmHg   Bicarbonate 26.6 20.0 - 28.0 mmol/L   Acid-Base Excess 1.4 0.0 - 2.0 mmol/L   Patient temperature 37.0    Collection site VENOUS    Sample type VENOUS     Comment: Performed at [facility], [address], [place], [place] [address]  Urine culture     Status: Abnormal (Preliminary result)   Collection Time: 04/20/2017 10:49 AM  Result Value Ref Range   Specimen Description      URINE, RANDOM Performed at [facility], [address], [place], [place] [address]    Special Requests      Normal Performed at [facility], [address] [place], [place] [address]    Culture (A)     >=100,000 COLONIES/mL ESCHERICHIA COLI SUSCEPTIBILITIES TO FOLLOW Performed at [facility], [address] [place], [place] [address]    Report Status PENDING   Lactic acid, plasma     Status: Abnormal   Collection Time: 04/23/2017  3:01 PM  Result Value Ref Range   Lactic Acid, Venous 3.1 (HH) 0.5 - 1.9 mmol/L    Comment: CRITICAL RESULT CALLED TO, READ BACK BY AND VERIFIED WITH DEIDRE MALCOLM AT 1551 ON 04/16/2017  JJB Performed at [facility], [address] [place], [place] [address]   Glucose, capillary     Status: Abnormal   Collection Time: 04/11/2017  5:23 PM  Result Value Ref Range   Glucose-Capillary 180 (H) 65 - 99 mg/dL  MRSA PCR Screening     Status: None   Collection Time: 04/14/2017  5:30 PM  Result Value Ref Range   MRSA by PCR NEGATIVE NEGATIVE    Comment:        The GeneXpert MRSA Assay (FDA approved for NASAL specimens only), is one component of a comprehensive MRSA colonization surveillance program. It is not intended to diagnose MRSA infection nor to guide or monitor treatment for MRSA infections. Performed at [facility], [address], [place], [place] [address]   Magnesium     Status: None   Collection Time: 04/20/2017  6:38 PM  Result Value Ref Range   Magnesium 2.2 1.7 - 2.4 mg/dL    Comment: Performed at [facility], [address] [place], [place] [address]  Troponin I     Status: Abnormal   Collection Time: 04/01/2017  6:38 PM  Result Value Ref Range   Troponin I 0.07 (HH) <0.03 ng/mL    Comment: CRITICAL VALUE NOTED. VALUE IS CONSISTENT WITH PREVIOUSLY REPORTED/CALLED VALUE JJB Performed at [facility], [address] [place], [place] [address]   Troponin I     Status: Abnormal   Collection Time: 04/24/2017  8:40 PM  Result Value Ref Range   Troponin I 0.06 (HH) <0.03 ng/mL    Comment: CRITICAL VALUE NOTED. VALUE IS CONSISTENT WITH PREVIOUSLY REPORTED/CALLED VALUE JJB Performed at [facility], [address] [place], [place] [address]   Glucose, capillary     Status: Abnormal   Collection Time: 04/04/2017  9:04 PM  Result Value Ref Range   Glucose-Capillary 173 (H) 65 - 99 mg/dL  Troponin I     Status: Abnormal   Collection Time: 04/22/17  2:43 AM  Result Value Ref Range   Troponin I 0.05 (HH) <0.03 ng/mL    Comment: CRITICAL VALUE NOTED. VALUE IS CONSISTENT WITH PREVIOUSLY REPORTED/CALLED VALUE.  JAG Performed at [facility], [address] [place], [place] [address]   Hemoglobin A1c     Status: Abnormal   Collection Time: 04/22/17  2:43 AM  Result Value Ref Range   Hgb A1c MFr Bld 5.9 (H) 4.8 - 5.6 %    Comment: (NOTE) Pre diabetes:          5.7%-6.4% Diabetes:              >6.4% Glycemic control for   <7.0% adults with diabetes    Mean Plasma Glucose 122.63 mg/dL    Comment: Performed at [facility] [address] [place], [place] [address]  Lipid panel     Status: Abnormal   Collection Time: 04/22/17  2:43 AM  Result Value Ref Range   Cholesterol 111 0 - 200 mg/dL   Triglycerides 129 <150 mg/dL   HDL 26 (L) >40 mg/dL   Total CHOL/HDL Ratio 4.3 RATIO   VLDL 26 0 - 40 mg/dL   LDL Cholesterol 59 0 - 99 mg/dL    Comment:        Total Cholesterol/HDL:CHD Risk Coronary Heart Disease Risk Table                     Men   Women  1/2 Average Risk   3.4   3.3  Average Risk       5.0   4.4  2 X Average Risk   9.6   7.1  3 X Average Risk  23.4   11.0        Use the calculated Patient Ratio above and the CHD Risk Table to determine the patient's CHD Risk.        ATP III CLASSIFICATION (LDL):  <100     mg/dL   Optimal  100-129  mg/dL   Near or Above                    Optimal  130-159  mg/dL   Borderline  160-189  mg/dL  High  >190     mg/dL   Very High Performed at [facility], [address] [place], [place] [address]   Blood gas, arterial     Status: Abnormal   Collection Time: 04/22/17 11:24 AM  Result Value Ref Range   FIO2 0.21    pH, Arterial 7.46 (H) 7.350 - 7.450   pCO2 arterial 41 32.0 - 48.0 mmHg   pO2, Arterial 67 (L) 83.0 - 108.0 mmHg   Bicarbonate 29.2 (H) 20.0 - 28.0 mmol/L   Acid-Base Excess 4.9 (H) 0.0 - 2.0 mmol/L   O2 Saturation 94.1 %   Patient temperature 37.0    Collection site RIGHT RADIAL    Sample type ARTERIAL DRAW    Allens test (pass/fail) PASS PASS    Comment: Performed at [facility],  [address] [place], [place] [address]  Basic metabolic panel     Status: Abnormal   Collection Time: 04/22/17 11:25 AM  Result Value Ref Range   Sodium 153 (H) 135 - 145 mmol/L   Potassium 2.8 (L) 3.5 - 5.1 mmol/L   Chloride 115 (H) 101 - 111 mmol/L   CO2 28 22 - 32 mmol/L   Glucose, Bld 200 (H) 65 - 99 mg/dL   BUN 44 (H) 6 - 20 mg/dL   Creatinine, Ser 0.89 0.44 - 1.00 mg/dL   Calcium 9.0 8.9 - 10.3 mg/dL   GFR calc non Af Amer >60 >60 mL/min   GFR calc Af Amer >60 >60 mL/min    Comment: (NOTE) The eGFR has been calculated using the CKD EPI equation. This calculation has not been validated in all clinical situations. eGFR's persistently <60 mL/min signify possible Chronic Kidney Disease.    Anion gap 10 5 - 15    Comment: Performed at [facility], [address] [place], [place] [address]  CBC     Status: Abnormal   Collection Time: 04/22/17 11:25 AM  Result Value Ref Range   WBC 7.5 3.6 - 11.0 K/uL   RBC 5.06 3.80 - 5.20 MIL/uL   Hemoglobin 13.5 12.0 - 16.0 g/dL   HCT 42.3 35.0 - 47.0 %   MCV 83.5 80.0 - 100.0 fL   MCH 26.8 26.0 - 34.0 pg   MCHC 32.0 32.0 - 36.0 g/dL   RDW 16.0 (H) 11.5 - 14.5 %   Platelets 295 150 - 440 K/uL    Comment: Performed at [facility], [address] [place], [place] [address]  Glucose, capillary     Status: Abnormal   Collection Time: 04/22/17 11:44 AM  Result Value Ref Range   Glucose-Capillary 190 (H) 65 - 99 mg/dL  Lactic acid, plasma     Status: None   Collection Time: 04/22/17 12:49 PM  Result Value Ref Range   Lactic Acid, Venous 1.8 0.5 - 1.9 mmol/L    Comment: Performed at [facility], [address] [place], [place] [address]   No components found for: ESR, C REACTIVE PROTEIN MICRO: Recent Results (from the past 720 hour(s))  Blood Culture (routine x 2)     Status: None (Preliminary result)   Collection Time: 04/23/2017 10:48 AM  Result Value Ref Range Status   Specimen Description BLOOD  RIGHT [facility]  Final   Special Requests   Final    BOTTLES DRAWN AEROBIC AND ANAEROBIC Blood Culture adequate volume   Culture  Setup Time   Final    GRAM POSITIVE COCCI IN BOTH AEROBIC AND  ANAEROBIC BOTTLES CRITICAL RESULT CALLED TO, READ BACK BY AND VERIFIED WITH: MATT MCBANE AT 0322 ON 04/22/17 [place]. Performed at [facility], [address] [place], [place] [address]    Culture [facility] POSITIVE COCCI  Final   Report Status PENDING  Incomplete  Blood Culture (routine x 2)     Status: None (Preliminary result)   Collection Time: 04/04/2017 10:48 AM  Result Value Ref Range Status   Specimen Description BLOOD Blood Culture adequate volume  Final   Special Requests BLOOD RIGHT HAND  Final   Culture  Setup Time   Final    GRAM POSITIVE COCCI ANAEROBIC BOTTLE ONLY CRITICAL VALUE NOTED.  VALUE IS CONSISTENT WITH PREVIOUSLY REPORTED AND CALLED VALUE. Performed at [facility], [address] [place], [place] [address]    Culture [facility] POSITIVE COCCI  Final   Report Status PENDING  Incomplete  Blood Culture ID Panel (Reflexed)     Status: Abnormal   Collection Time: 04/28/2017 10:48 AM  Result Value Ref Range Status   Enterococcus species NOT DETECTED NOT DETECTED Final   Vancomycin resistance NOT DETECTED NOT DETECTED Final   Listeria monocytogenes NOT DETECTED NOT DETECTED Final   Staphylococcus species DETECTED (A) NOT DETECTED Final    Comment: CRITICAL RESULT CALLED TO, READ BACK BY AND VERIFIED WITH: MATT MCBANE AT 0322 ON 04/22/17 [place].    Staphylococcus aureus DETECTED (A) NOT DETECTED Final    Comment: Methicillin (oxacillin) susceptible Staphylococcus aureus (MSSA). Preferred therapy is anti staphylococcal beta lactam antibiotic (Cefazolin or Nafcillin), unless clinically contraindicated. CRITICAL RESULT CALLED TO, READ BACK BY AND VERIFIED WITH: MATT MCBANE AT 0322 ON 04/22/17 [place].    Methicillin resistance NOT DETECTED NOT DETECTED Final   Streptococcus species NOT  DETECTED NOT DETECTED Final   Streptococcus agalactiae NOT DETECTED NOT DETECTED Final   Streptococcus pneumoniae NOT DETECTED NOT DETECTED Final   Streptococcus pyogenes NOT DETECTED NOT DETECTED Final   Acinetobacter baumannii NOT DETECTED NOT DETECTED Final   Enterobacteriaceae species NOT DETECTED NOT DETECTED Final   Enterobacter cloacae complex NOT DETECTED NOT DETECTED Final   Escherichia coli NOT DETECTED NOT DETECTED Final   Klebsiella oxytoca NOT DETECTED NOT DETECTED Final   Klebsiella pneumoniae NOT DETECTED NOT DETECTED Final   Proteus species NOT DETECTED NOT DETECTED Final   Serratia marcescens NOT DETECTED NOT DETECTED Final   Carbapenem resistance NOT DETECTED NOT DETECTED Final   Haemophilus influenzae NOT DETECTED NOT DETECTED Final   Neisseria meningitidis NOT DETECTED NOT DETECTED Final   Pseudomonas aeruginosa NOT DETECTED NOT DETECTED Final   Candida albicans NOT DETECTED NOT DETECTED Final   Candida glabrata NOT DETECTED NOT DETECTED Final   Candida krusei NOT DETECTED NOT DETECTED Final   Candida parapsilosis NOT DETECTED NOT DETECTED Final   Candida tropicalis NOT DETECTED NOT DETECTED Final    Comment: Performed at [facility], [address] [place], [place] [address]  Urine culture     Status: Abnormal (Preliminary result)   Collection Time: 04/28/2017 10:49 AM  Result Value Ref Range Status   Specimen Description   Final    URINE, RANDOM Performed at [facility], [address], [place], [place] [address]    Special Requests   Final    Normal Performed at [facility], [address] [place], [place] [address]    Culture (A)  Final    >=100,000 COLONIES/mL ESCHERICHIA COLI SUSCEPTIBILITIES TO FOLLOW Performed at [facility], [address] [place], [place] [address]    Report Status PENDING  Incomplete  MRSA PCR Screening     Status: None   Collection Time: 04/05/2017  5:30 PM  Result Value Ref Range Status    MRSA by PCR NEGATIVE NEGATIVE Final    Comment:        The GeneXpert MRSA Assay (FDA approved for NASAL specimens only), is one component of a comprehensive MRSA colonization surveillance program. It is not intended to diagnose MRSA infection nor to guide or monitor treatment for MRSA infections. Performed at [facility], [address] [place], [place] [address]     IMAGING: Dg Chest 2 View  Result Date: 03/27/2017 CLINICAL DATA:  Generalized weakness over the several past month. EXAM: CHEST  2 VIEW COMPARISON:  March 06, 2017 FINDINGS: The mediastinal contour is normal. The heart size mildly enlarged. There is mild diffuse increased pulmonary interstitium. There is no pulmonary edema or pleural effusion. The visualized skeletal structures are unremarkable. IMPRESSION: Mild interstitial edema. Electronically Signed   By: Abelardo Diesel M.D.   On: 03/27/2017 15:04   Ct Head Wo Contrast  Result Date: 04/24/2017 CLINICAL DATA:  Fall EXAM: CT HEAD WITHOUT CONTRAST CT CERVICAL SPINE WITHOUT CONTRAST TECHNIQUE: Multidetector CT imaging of the head and cervical spine was performed following the standard protocol without intravenous contrast. Multiplanar CT image reconstructions of the cervical spine were also generated. COMPARISON:  None. FINDINGS: CT HEAD FINDINGS Brain: Apparent low-density area within the inferior right cerebellar hemisphere. This area is difficult to evaluate by CT, but cannot exclude acute to subacute infarction. No hemorrhage or hydrocephalus. No midline shift. Vascular: No hyperdense vessel or unexpected calcification. Skull: No acute calvarial abnormality. Sinuses/Orbits: Visualized paranasal sinuses and mastoids clear. Orbital soft tissues unremarkable. Other: Soft tissue swelling in the right lateral scalp. CT CERVICAL SPINE FINDINGS Alignment: Normal Skull base and vertebrae: No fracture Soft tissues and spinal canal: Prevertebral soft tissues are normal.  No epidural or paraspinal hematoma. Disc levels: Degenerative disc disease from C5-6 through C7-T1. Disc space narrowing and spurring. Upper chest: Negative Other: No acute findings.  Carotid artery calcifications. IMPRESSION: Apparent low-density in the inferior right cerebellar hemisphere. This area is difficult to evaluate by CT and could be artifactual, but cannot exclude acute to subacute right cerebellar infarct. No evidence of hemorrhage. No acute bony abnormality in the cervical spine. Electronically Signed   By: Rolm Baptise M.D.   On: 04/03/2017 11:17   Ct Head Wo Contrast  Result Date: 03/27/2017 CLINICAL DATA:  Multiple falls today. EXAM: CT HEAD WITHOUT CONTRAST TECHNIQUE: Contiguous axial images were obtained from the base of the skull through the vertex without intravenous contrast. COMPARISON:  03/06/2017 head CT. FINDINGS: Brain: No evidence of parenchymal hemorrhage or extra-axial fluid collection. No mass lesion, mass effect, or midline shift. No CT evidence of acute infarction. Nonspecific stable mild subcortical and periventricular white matter hypodensity, most in keeping with chronic small vessel ischemic change. Cerebral volume is age appropriate. No ventriculomegaly. Vascular: No acute abnormality. Skull: No evidence of calvarial fracture. Sinuses/Orbits: Mild opacification of the right ethmoidal air cells. No fluid levels. Other:  The mastoid air cells are unopacified. IMPRESSION: 1. No evidence of acute intracranial abnormality. No evidence of calvarial fracture. 2. Mild chronic small vessel ischemic change in the cerebral white matter. Electronically Signed   By: Ilona Sorrel M.D.   On: 03/27/2017 14:41   Ct Cervical Spine Wo Contrast  Result Date: 04/20/2017 CLINICAL DATA:  Fall EXAM: CT HEAD WITHOUT CONTRAST CT CERVICAL SPINE WITHOUT CONTRAST TECHNIQUE: Multidetector CT imaging of the head and cervical spine was performed following the standard protocol without intravenous  contrast. Multiplanar CT image reconstructions of the cervical spine were also generated. COMPARISON:  None. FINDINGS: CT HEAD FINDINGS Brain: Apparent low-density area within the inferior right cerebellar hemisphere. This area is difficult to evaluate by CT, but cannot exclude acute to subacute infarction. No hemorrhage or hydrocephalus. No midline shift. Vascular: No hyperdense vessel or unexpected calcification. Skull: No acute calvarial abnormality. Sinuses/Orbits: Visualized paranasal sinuses and mastoids clear. Orbital soft tissues unremarkable. Other: Soft tissue swelling in the right lateral scalp. CT CERVICAL SPINE FINDINGS Alignment: Normal Skull base and vertebrae: No fracture Soft tissues and spinal canal: Prevertebral soft tissues are normal. No epidural or paraspinal hematoma. Disc levels: Degenerative disc disease from C5-6 through C7-T1. Disc space narrowing and spurring. Upper chest: Negative Other: No acute findings.  Carotid artery calcifications. IMPRESSION: Apparent low-density in the inferior right cerebellar hemisphere. This area is difficult to evaluate by CT and could be artifactual, but cannot exclude acute to subacute right cerebellar infarct. No evidence of hemorrhage. No acute bony abnormality in the cervical spine. Electronically Signed   By: Rolm Baptise M.D.   On: 04/10/2017 11:17   Mr Brain Wo Contrast  Result Date: 04/22/2017 CLINICAL DATA:  Initial evaluation for acute altered mental status, recent falls. Question possible stroke on previous CT. EXAM: MRI HEAD WITHOUT CONTRAST MRA HEAD WITHOUT CONTRAST TECHNIQUE: Multiplanar, multiecho pulse sequences of the brain and surrounding structures were obtained without intravenous contrast. Angiographic images of the head were obtained using MRA technique without contrast. COMPARISON:  Prior CT from 04/24/2017. FINDINGS: MRI HEAD FINDINGS Brain: Study mildly degraded by motion artifact. Mild diffuse age-related cerebral atrophy.  Patchy T2/FLAIR hyperintensity within the periventricular white matter most consistent with chronic microvascular ischemic disease, mild for age. There is abnormal restricted diffusion involving the cortical gray matter and underlying subcortical and deep white matter of the bilateral frontal and parietal lobes, left greater than right (series 100, image 39) this extends in a somewhat linear fashion, suggesting watershed type infarcts. Additional patchy areas of infarction present within the bilateral occipital lobes (series 100, image 30, 24). No associated hemorrhage or mass effect. No other areas of acute or subacute infarction. No acute or chronic intracranial hemorrhage. No mass lesion, midline shift, or mass effect. No hydrocephalus. No extra-axial fluid collection. Major dural sinuses are grossly patent. Pituitary gland suprasellar region normal. Midline structures intact and normal. Vascular: Major intracranial vascular flow voids are maintained. Skull and upper cervical spine: Craniocervical junction within normal limits. Bone marrow signal intensity normal. No scalp soft tissue abnormality. Sinuses/Orbits: Globes and orbital soft tissues within normal limits. Mild scattered mucosal thickening within the ethmoidal air cells. Paranasal sinuses are otherwise clear. Small left mastoid effusion noted, of doubtful significance. Inner ear structures normal. Other: None. MRA HEAD FINDINGS ANTERIOR CIRCULATION: Study moderately degraded by motion artifact, limiting evaluation of the mid and distal vasculature. Distal cervical segments of the internal carotid arteries patent at the skull base. Petrous, cavernous, and supraclinoid segments patent without flow-limiting stenosis. ICA termini widely patent. A1 segments patent bilaterally. Grossly normal anterior communicating artery. Anterior cerebral arteries grossly patent to their distal aspects, although limited evaluation due to motion. Moderate atheromatous  irregularity involving the ACA is suspected. M1 segments patent without obvious stenosis. Normal MCA bifurcations. No appreciable proximal M2 occlusion. Distal MCA branches not well evaluated on  this motion degraded exam, but are grossly patent. POSTERIOR CIRCULATION: Vertebral arteries patent to the vertebrobasilar junction. Basilar artery patent to its distal aspect without flow-limiting stenosis. Superior cerebral arteries patent proximally. Left PCA supplied primarily via the basilar. Hypoplastic right P1 segment with prominent right posterior communicating artery. PCAs demonstrate moderate atheromatous irregularity with moderate to severe multifocal bilateral P2 stenoses. No appreciable aneurysm. IMPRESSION: MRI HEAD IMPRESSION: 1. 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 2. Mild age-related cerebral atrophy with chronic small vessel ischemic disease. MRA HEAD IMPRESSION: 1. Motion degraded exam. 2. No large or proximal arterial branch occlusion. No proximal or high-grade correctable stenosis identified. 3. Mild to moderate intracranial atherosclerotic change, most notable within the PCAs bilaterally. Electronically Signed   By: Jeannine Boga M.D.   On: 04/22/2017 01:50   US Carotid Bilateral (at [facility] And [facility] Only)  Result Date: 04/22/2017 CLINICAL DATA:  CVA EXAM: BILATERAL CAROTID DUPLEX ULTRASOUND TECHNIQUE: Pearline Cables scale imaging, color Doppler and duplex ultrasound were performed of bilateral carotid and vertebral arteries in the neck. COMPARISON:  None. FINDINGS: Criteria: Quantification of carotid stenosis is based on velocity parameters that correlate the residual internal carotid diameter with NASCET-based stenosis levels, using the diameter of the distal internal carotid lumen as the denominator for stenosis measurement. The following velocity measurements were  obtained: RIGHT ICA:  95 cm/sec CCA:  557 cm/sec SYSTOLIC ICA/CCA RATIO:  0.7 DIASTOLIC ICA/CCA RATIO:  1.9 ECA:  249 cm/sec LEFT ICA:  107 cm/sec CCA:  322 cm/sec SYSTOLIC ICA/CCA RATIO:  0.8 DIASTOLIC ICA/CCA RATIO:  2.2 ECA:  173 cm/sec RIGHT CAROTID ARTERY: Mild calcified plaque in the bulb. Low resistance internal carotid Doppler pattern. RIGHT VERTEBRAL ARTERY:  Antegrade. LEFT CAROTID ARTERY: Moderate irregular calcified plaque in the bulb. Low resistance internal carotid Doppler pattern is preserved. Calcified plaque does extend into the internal carotid artery. LEFT VERTEBRAL ARTERY:  Antegrade. IMPRESSION: Less than 50% stenosis in the right and left internal carotid arteries. Electronically Signed   By: Marybelle Killings M.D.   On: 04/22/2017 08:59   Dg Chest Port 1 View  Result Date: 04/04/2017 CLINICAL DATA:  Suspect sepsis. EXAM: PORTABLE CHEST 1 VIEW COMPARISON:  None. FINDINGS: The heart size and mediastinal contours are within normal limits. Both lungs are clear. The visualized skeletal structures are unremarkable. IMPRESSION: No active disease. Electronically Signed   By: Kerby Moors M.D.   On: 04/09/2017 11:27   Dg Knee Complete 4 Views Right  Result Date: 03/27/2017 CLINICAL DATA:  Fall.  Right knee pain. EXAM: RIGHT KNEE - COMPLETE 4+ VIEW COMPARISON:  None. FINDINGS: No fracture, joint effusion or dislocation. No suspicious focal osseous lesion. Moderate tricompartmental right knee osteoarthritis. No radiopaque foreign bodies. IMPRESSION: 1. No right knee fracture, joint effusion or dislocation. 2. Moderate tricompartmental right knee osteoarthritis. Electronically Signed   By: Ilona Sorrel M.D.   On: 03/27/2017 15:06   Mr Jodene Nam Head/brain GU Cm  Result Date: 04/22/2017 CLINICAL DATA:  Initial evaluation for acute altered mental status, recent falls. Question possible stroke on previous CT. EXAM: MRI HEAD WITHOUT CONTRAST MRA HEAD WITHOUT CONTRAST TECHNIQUE: Multiplanar, multiecho  pulse sequences of the brain and surrounding structures were obtained without intravenous contrast. Angiographic images of the head were obtained using MRA technique without contrast. COMPARISON:  Prior CT from 04/03/2017. FINDINGS: MRI HEAD FINDINGS Brain: Study mildly degraded by motion artifact. Mild diffuse age-related cerebral atrophy.  Patchy T2/FLAIR hyperintensity within the periventricular white matter most consistent with chronic microvascular ischemic disease, mild for age. There is abnormal restricted diffusion involving the cortical gray matter and underlying subcortical and deep white matter of the bilateral frontal and parietal lobes, left greater than right (series 100, image 39) this extends in a somewhat linear fashion, suggesting watershed type infarcts. Additional patchy areas of infarction present within the bilateral occipital lobes (series 100, image 30, 24). No associated hemorrhage or mass effect. No other areas of acute or subacute infarction. No acute or chronic intracranial hemorrhage. No mass lesion, midline shift, or mass effect. No hydrocephalus. No extra-axial fluid collection. Major dural sinuses are grossly patent. Pituitary gland suprasellar region normal. Midline structures intact and normal. Vascular: Major intracranial vascular flow voids are maintained. Skull and upper cervical spine: Craniocervical junction within normal limits. Bone marrow signal intensity normal. No scalp soft tissue abnormality. Sinuses/Orbits: Globes and orbital soft tissues within normal limits. Mild scattered mucosal thickening within the ethmoidal air cells. Paranasal sinuses are otherwise clear. Small left mastoid effusion noted, of doubtful significance. Inner ear structures normal. Other: None. MRA HEAD FINDINGS ANTERIOR CIRCULATION: Study moderately degraded by motion artifact, limiting evaluation of the mid and distal vasculature. Distal cervical segments of the internal carotid arteries patent at  the skull base. Petrous, cavernous, and supraclinoid segments patent without flow-limiting stenosis. ICA termini widely patent. A1 segments patent bilaterally. Grossly normal anterior communicating artery. Anterior cerebral arteries grossly patent to their distal aspects, although limited evaluation due to motion. Moderate atheromatous irregularity involving the ACA is suspected. M1 segments patent without obvious stenosis. Normal MCA bifurcations. No appreciable proximal M2 occlusion. Distal MCA branches not well evaluated on this motion degraded exam, but are grossly patent. POSTERIOR CIRCULATION: Vertebral arteries patent to the vertebrobasilar junction. Basilar artery patent to its distal aspect without flow-limiting stenosis. Superior cerebral arteries patent proximally. Left PCA supplied primarily via the basilar. Hypoplastic right P1 segment with prominent right posterior communicating artery. PCAs demonstrate moderate atheromatous irregularity with moderate to severe multifocal bilateral P2 stenoses. No appreciable aneurysm. IMPRESSION: MRI HEAD IMPRESSION: 1. 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 2. Mild age-related cerebral atrophy with chronic small vessel ischemic disease. MRA HEAD IMPRESSION: 1. Motion degraded exam. 2. No large or proximal arterial branch occlusion. No proximal or high-grade correctable stenosis identified. 3. Mild to moderate intracranial atherosclerotic change, most notable within the PCAs bilaterally. Electronically Signed   By: Jeannine Boga M.D.   On: 04/22/2017 01:50    Assessment:   Nylee Barbuto is a 67 y.o. female admitted with AMS and falls and found to have multiple strokes, MSSA bacteremia, Parotitis. She is quite ill.  Most likely she had CVA and became dehydrated and then developed parotitis leading to the MSSA bacteremia.   Has E coli UTI as well.  However she may have endocarditis leading to septic CNS emboli. Will need wu for endocarditis.  Recommendations Echo Pending  Repeat BCX ordered Can dc vanco - change to ancef Follow bcx and UCX results Poor prognosis. Thank you very much for allowing me to participate in the care of this patient. Please call with questions.   Cheral Marker. Ola Spurr, MD

## 2017-04-22 NOTE — Progress Notes (Signed)
Inpatient Diabetes Program Recommendations  AACE/ADA: New Consensus Statement on Inpatient Glycemic Control (2015)  Target Ranges:  Prepandial:   less than 140 mg/dL      Peak postprandial:   less than 180 mg/dL (1-2 hours)      Critically ill patients:  140 - 180 mg/dL   Lab Results  Component Value Date   GLUCAP 190 (H) 04/22/2017   HGBA1C 5.9 (H) 04/22/2017    Review of Glycemic Control  Results for [name], Sierra (MRN [id number]) as of 04/22/2017 13:08  Ref. Range August 28, 2017 17:23 August 28, 2017 21:04 04/22/2017 11:44  Glucose-Capillary Latest Ref Range: 65 - 99 mg/dL 784180 (H) 696173 (H) 295190 (H)    Diabetes history: DM2 Outpatient Diabetes medications: Metformin 1000 mg BID  Current orders for Inpatient glycemic control: Novolog 0-15 units TID with meals    Inpatient Diabetes Program Recommendations: Please consider ordering Novolog 0-5 units QHS for bedtime correction.  Susette RacerJulie Shoshanah Dapper, RN, BA, MHA, CDE Diabetes Coordinator Inpatient Diabetes Program  [phone] (Team Pager) [phone] [facility] Office) 04/22/2017 1:21 PM

## 2017-04-22 NOTE — Progress Notes (Signed)
PT Cancellation Note  Patient Details Name: Sierra AmosCelia Bass MRN: [id number] DOB: 09/05/1950   Cancelled Treatment:    Reason Eval/Treat Not Completed: Other (comment). Consult received and chart reviewed. Per notes, pt with decline in status. Spoke with RN who states, pt very lethargic almost unresponsive. Not appropriate for PT consult at this time. Will re-attempt next date if appropriate.   Caressa Scearce 04/22/2017, 3:17 PM Elizabeth PalauStephanie Carlena Ruybal, PT, DPT [phone]

## 2017-04-22 NOTE — Consult Note (Signed)
Sierra Bass, Sierra Bass [id number] 06/29/1950 Sierra Bass, Aruna, MD  Reason for Consult: Right facial swelling  HPI: 67 year old female with multiple medical problems admitted for probable sepsis was noted to have right facial swelling. She is nonresponsive and could not answer any history. Review of old scans of the head from December did not show any significant swelling. It is unclear exactly how long the swelling has been present.  Allergies:  Allergies  Allergen Reactions  . Abilify [Aripiprazole]     Tardive dyskinesia     ROS: Review of systems normal other than 12 systems except per HPI.  PMH:  Past Medical History:  Diagnosis Date  . Diabetes mellitus without complication (HCC)   . Hypertension     FH:  Family History  Problem Relation Age of Onset  . Stroke Mother   . Cancer Father     SH:  Social History   Socioeconomic History  . Marital status: Divorced    Spouse name: Not on file  . Number of children: Not on file  . Years of education: Not on file  . Highest education level: Not on file  Social Needs  . Financial resource strain: Not on file  . Food insecurity - worry: Not on file  . Food insecurity - inability: Not on file  . Transportation needs - medical: Not on file  . Transportation needs - non-medical: Not on file  Occupational History  . Not on file  Tobacco Use  . Smoking status: Current Every Day Smoker    Packs/day: 1.00    Types: Cigarettes  . Smokeless tobacco: Never Used  Substance and Sexual Activity  . Alcohol use: No    Frequency: Never  . Drug use: Yes    Types: Marijuana    Comment: Per family 6 months ago  . Sexual activity: Not on file  Other Topics Concern  . Not on file  Social History Narrative  . Not on file    PSH:  Past Surgical History:  Procedure Laterality Date  . ABDOMINAL HYSTERECTOMY    . CESAREAN SECTION      Physical  Exam: CN 2-12 grossly intact and symmetric. EAC normal BL. Oral cavity, lips, gums,  ororpharynx normal with severe dry mucous membranes there is pus coming from Stensen's duct on the right. Skin warm and dry. Nasal cavity without polyps or purulence. External nose and ears without masses or lesions. Neuro exam impossible due to her unresponsiveness. Obvious swelling of right parotid with erythema overlying the parotid gland. Thyroid normal with no masses.   A/P: Acute parotitis-a culture was taken of the pus coming from right Stensen's duct.  Currently on Zosyn and vancomycin for sepsis. These drugs should cover any bacterial infection of the parotid. I do not think that she needs a CT scan at this point for parotitis. Would recommend continue to follow the cultures and treat appropriately based on sensitivities.     Davina PokeChapman T Zaxton Angerer 04/22/2017 4:07 PM

## 2017-04-22 NOTE — Consult Note (Addendum)
MEDICATION RELATED CONSULT NOTE - INITIAL   Pharmacy Consult for electrolytes Indication: hypokalemia  Allergies  Allergen Reactions  . Abilify [Aripiprazole]     Tardive dyskinesia     Patient Measurements: Height: 5\' 2"  (157.5 cm) Weight: 233 lb (105.7 kg) IBW/kg (Calculated) : 50.1 Adjusted Body Weight:   Vital Signs: Temp: 97.6 F (36.4 C) (01/25 1100) Temp Source: Oral (01/25 1100) BP: 148/52 (01/25 1503) Pulse Rate: 80 (01/25 1503) Intake/Output from previous day: 01/24 0701 - 01/25 0700 In: 1122 [I.V.:872; IV Piggyback:250] Out: -  Intake/Output from this shift: No intake/output data recorded.  Labs: Recent Labs    04/29/17 1047 Apr 29, 2017 1838 04/22/17 1125  WBC 13.9*  --  7.5  HGB 15.3  --  13.5  HCT 47.9*  --  42.3  PLT 347  --  295  APTT 26  --   --   CREATININE 1.52*  --  0.89  MG  --  2.2  --   ALBUMIN 3.5  --   --   PROT 7.5  --   --   AST 57*  --   --   ALT 46  --   --   ALKPHOS 88  --   --   BILITOT 1.3*  --   --    Estimated Creatinine Clearance: 71.9 mL/min (by C-G formula based on SCr of 0.89 mg/dL).   Microbiology: Recent Results (from the past 720 hour(s))  Blood Culture (routine x 2)     Status: None (Preliminary result)   Collection Time: 04-29-2017 10:48 AM  Result Value Ref Range Status   Specimen Description BLOOD RIGHT [facility]  Final   Special Requests   Final    BOTTLES DRAWN AEROBIC AND ANAEROBIC Blood Culture adequate volume   Culture  Setup Time   Final    GRAM POSITIVE COCCI IN BOTH AEROBIC AND ANAEROBIC BOTTLES CRITICAL RESULT CALLED TO, READ BACK BY AND VERIFIED WITH: MATT MCBANE AT 0322 ON 04/22/17 [facility]. Performed at [facility], [address] [place], [place] [address]    Culture [facility] POSITIVE COCCI  Final   Report Status PENDING  Incomplete  Blood Culture (routine x 2)     Status: None (Preliminary result)   Collection Time: 04-29-2017 10:48 AM  Result Value Ref Range Status   Specimen Description BLOOD  Blood Culture adequate volume  Final   Special Requests BLOOD RIGHT HAND  Final   Culture  Setup Time   Final    GRAM POSITIVE COCCI ANAEROBIC BOTTLE ONLY CRITICAL VALUE NOTED.  VALUE IS CONSISTENT WITH PREVIOUSLY REPORTED AND CALLED VALUE. Performed at [facility], [address] [place], [place] [address]    Culture [facility] POSITIVE COCCI  Final   Report Status PENDING  Incomplete  Blood Culture ID Panel (Reflexed)     Status: Abnormal   Collection Time: 04/29/2017 10:48 AM  Result Value Ref Range Status   Enterococcus species NOT DETECTED NOT DETECTED Final   Vancomycin resistance NOT DETECTED NOT DETECTED Final   Listeria monocytogenes NOT DETECTED NOT DETECTED Final   Staphylococcus species DETECTED (A) NOT DETECTED Final    Comment: CRITICAL RESULT CALLED TO, READ BACK BY AND VERIFIED WITH: MATT MCBANE AT 0322 ON 04/22/17 [facility].    Staphylococcus aureus DETECTED (A) NOT DETECTED Final    Comment: Methicillin (oxacillin) susceptible Staphylococcus aureus (MSSA). Preferred therapy is anti staphylococcal beta lactam antibiotic (Cefazolin or Nafcillin), unless clinically contraindicated. CRITICAL RESULT CALLED TO, READ BACK BY AND  VERIFIED WITH: MATT MCBANE AT 0322 ON 04/22/17 [facility].    Methicillin resistance NOT DETECTED NOT DETECTED Final   Streptococcus species NOT DETECTED NOT DETECTED Final   Streptococcus agalactiae NOT DETECTED NOT DETECTED Final   Streptococcus pneumoniae NOT DETECTED NOT DETECTED Final   Streptococcus pyogenes NOT DETECTED NOT DETECTED Final   Acinetobacter baumannii NOT DETECTED NOT DETECTED Final   Enterobacteriaceae species NOT DETECTED NOT DETECTED Final   Enterobacter cloacae complex NOT DETECTED NOT DETECTED Final   Escherichia coli NOT DETECTED NOT DETECTED Final   Klebsiella oxytoca NOT DETECTED NOT DETECTED Final   Klebsiella pneumoniae NOT DETECTED NOT DETECTED Final   Proteus species NOT DETECTED NOT DETECTED Final   Serratia marcescens NOT  DETECTED NOT DETECTED Final   Carbapenem resistance NOT DETECTED NOT DETECTED Final   Haemophilus influenzae NOT DETECTED NOT DETECTED Final   Neisseria meningitidis NOT DETECTED NOT DETECTED Final   Pseudomonas aeruginosa NOT DETECTED NOT DETECTED Final   Candida albicans NOT DETECTED NOT DETECTED Final   Candida glabrata NOT DETECTED NOT DETECTED Final   Candida krusei NOT DETECTED NOT DETECTED Final   Candida parapsilosis NOT DETECTED NOT DETECTED Final   Candida tropicalis NOT DETECTED NOT DETECTED Final    Comment: Performed at [facility], [address], [place], [place] [address]  Urine culture     Status: Abnormal (Preliminary result)   Collection Time: 04/16/2017 10:49 AM  Result Value Ref Range Status   Specimen Description   Final    URINE, RANDOM Performed at [facility], [address] [place], [place] [address]    Special Requests   Final    Normal Performed at [facility], [address] [place], [place] [address]    Culture (A)  Final    >=100,000 COLONIES/mL ESCHERICHIA COLI SUSCEPTIBILITIES TO FOLLOW Performed at [facility], [address], [place], [place] [address]    Report Status PENDING  Incomplete  MRSA PCR Screening     Status: None   Collection Time: 04/15/2017  5:30 PM  Result Value Ref Range Status   MRSA by PCR NEGATIVE NEGATIVE Final    Comment:        The GeneXpert MRSA Assay (FDA approved for NASAL specimens only), is one component of a comprehensive MRSA colonization surveillance program. It is not intended to diagnose MRSA infection nor to guide or monitor treatment for MRSA infections. Performed at [facility], [address], [place], [place] [address]     Medical History: Past Medical History:  Diagnosis Date  . Diabetes mellitus without complication (HCC)   . Hypertension     Medications:  Scheduled:  . aspirin  300 mg Rectal Daily   Or  . aspirin EC  325 mg Oral Daily  .  heparin  5,000 Units Subcutaneous Q8H  . insulin aspart  0-15 Units Subcutaneous TID WC  . insulin aspart  0-5 Units Subcutaneous QHS  . mouth rinse  15 mL Mouth Rinse BID  . oxybutynin  25 mg Oral Daily  . pramipexole  1.5 mg Oral Daily  . simvastatin  10 mg Oral Daily  . venlafaxine XR  300 mg Oral Daily    Assessment: Patient is a 67 year old female with severe malnutrition, no po intake for 1 week, sepsis, CVA, encephalopathy. K this AM=2.8. Mg yesterday was 2.2. Prime Dr ordered KCL 10 MEQ IV x 4. Pt also has D5W w/ 20 MEQ KCL @ 77ml/hr (1.5 MEQ/hr)  hanging.  Goal of Therapy:  Normalization of electrolytes  Plan:  I have ordered an add on Mg and phos level. Replace these as needed. Continue with KCL IVPB runs x4. Recheck K @ 2100.  Addendum: add on Mg WNL, phos low. Will replace phos after K level results at 2100. K level will depend on how phos is replaced. Will need to be IV  Olene FlossMelissa D Maccia, Pharm.D, BCPS Clinical Pharmacist  04/22/2017,3:33 PM

## 2017-04-22 NOTE — Progress Notes (Addendum)
[facility] - [place] at [facility]   PATIENT NAME: Sierra Bass    MR#:  [id number]  DATE OF BIRTH:  [birth year]  SUBJECTIVE:  CHIEF COMPLAINT:  Pt is not arousable to verbal commands.  Patient's brother, son and daughter-in-law at bedside  REVIEW OF SYSTEMS:  Review of system unobtainable as the patient is encephalopathic  DRUG ALLERGIES:   Allergies  Allergen Reactions  . Abilify [Aripiprazole]     Tardive dyskinesia     VITALS:  Blood pressure (!) 148/52, pulse 80, temperature 97.6 F (36.4 C), temperature source Oral, resp. rate (!) 24, height 5\' 2"  (1.575 m), weight 105.7 kg (233 lb), SpO2 97 %.  PHYSICAL EXAMINATION:  GENERAL:  67 y.o.-year-old patient lying in the bed with no acute distress.  EYES: Pupils equal, round, reactive to light and accommodation. No scleral icterus.  HEENT: Head atraumatic, normocephalic.  Right maxillary area is edematous, tender NECK:  Supple, no jugular venous distention. No thyroid enlargement, no tenderness.  LUNGS: Moderately diminished breath sounds bilaterally, no wheezing, rales,rhonchi or crepitation. No use of accessory muscles of respiration.  CARDIOVASCULAR: S1, S2 normal. No murmurs, rubs, or gallops.  ABDOMEN: Soft, nontender, nondistended. Bowel sounds present. No organomegaly or mass.  EXTREMITIES: No pedal edema, cyanosis, or clubbing.  NEUROLOGIC: obtunded PSYCHIATRIC: The patient is encephalopathic  SKIN: No obvious rash, lesion, or ulcer.    LABORATORY PANEL:   CBC Recent Labs  Lab 04/22/17 1125  WBC 7.5  HGB 13.5  HCT 42.3  PLT 295   ------------------------------------------------------------------------------------------------------------------  Chemistries  Recent Labs  Lab 04/06/2017 1047 04/24/2017 1838 04/22/17 1125  NA 155*  --  153*  K 3.2*  --  2.8*  CL 113*  --  115*  CO2 25  --  28  GLUCOSE 253*  --  200*  BUN 55*  --  44*  CREATININE 1.52*  --  0.89  CALCIUM 10.1   --  9.0  MG  --  2.2  --   AST 57*  --   --   ALT 46  --   --   ALKPHOS 88  --   --   BILITOT 1.3*  --   --    ------------------------------------------------------------------------------------------------------------------  Cardiac Enzymes Recent Labs  Lab 04/22/17 0243  TROPONINI 0.05*   ------------------------------------------------------------------------------------------------------------------  RADIOLOGY:  Ct Head Wo Contrast  Result Date: 04/18/2017 CLINICAL DATA:  Fall EXAM: CT HEAD WITHOUT CONTRAST CT CERVICAL SPINE WITHOUT CONTRAST TECHNIQUE: Multidetector CT imaging of the head and cervical spine was performed following the standard protocol without intravenous contrast. Multiplanar CT image reconstructions of the cervical spine were also generated. COMPARISON:  None. FINDINGS: CT HEAD FINDINGS Brain: Apparent low-density area within the inferior right cerebellar hemisphere. This area is difficult to evaluate by CT, but cannot exclude acute to subacute infarction. No hemorrhage or hydrocephalus. No midline shift. Vascular: No hyperdense vessel or unexpected calcification. Skull: No acute calvarial abnormality. Sinuses/Orbits: Visualized paranasal sinuses and mastoids clear. Orbital soft tissues unremarkable. Other: Soft tissue swelling in the right lateral scalp. CT CERVICAL SPINE FINDINGS Alignment: Normal Skull base and vertebrae: No fracture Soft tissues and spinal canal: Prevertebral soft tissues are normal. No epidural or paraspinal hematoma. Disc levels: Degenerative disc disease from C5-6 through C7-T1. Disc space narrowing and spurring. Upper chest: Negative Other: No acute findings.  Carotid artery calcifications. IMPRESSION: Apparent low-density in the inferior right cerebellar hemisphere. This area is difficult to evaluate by CT and could be artifactual, but cannot  exclude acute to subacute right cerebellar infarct. No evidence of hemorrhage. No acute bony abnormality  in the cervical spine. Electronically Signed   By: Charlett Nose M.D.   On: 04/09/2017 11:17   Ct Cervical Spine Wo Contrast  Result Date: 04/11/2017 CLINICAL DATA:  Fall EXAM: CT HEAD WITHOUT CONTRAST CT CERVICAL SPINE WITHOUT CONTRAST TECHNIQUE: Multidetector CT imaging of the head and cervical spine was performed following the standard protocol without intravenous contrast. Multiplanar CT image reconstructions of the cervical spine were also generated. COMPARISON:  None. FINDINGS: CT HEAD FINDINGS Brain: Apparent low-density area within the inferior right cerebellar hemisphere. This area is difficult to evaluate by CT, but cannot exclude acute to subacute infarction. No hemorrhage or hydrocephalus. No midline shift. Vascular: No hyperdense vessel or unexpected calcification. Skull: No acute calvarial abnormality. Sinuses/Orbits: Visualized paranasal sinuses and mastoids clear. Orbital soft tissues unremarkable. Other: Soft tissue swelling in the right lateral scalp. CT CERVICAL SPINE FINDINGS Alignment: Normal Skull base and vertebrae: No fracture Soft tissues and spinal canal: Prevertebral soft tissues are normal. No epidural or paraspinal hematoma. Disc levels: Degenerative disc disease from C5-6 through C7-T1. Disc space narrowing and spurring. Upper chest: Negative Other: No acute findings.  Carotid artery calcifications. IMPRESSION: Apparent low-density in the inferior right cerebellar hemisphere. This area is difficult to evaluate by CT and could be artifactual, but cannot exclude acute to subacute right cerebellar infarct. No evidence of hemorrhage. No acute bony abnormality in the cervical spine. Electronically Signed   By: Charlett Nose M.D.   On: 04/23/2017 11:17   Mr Brain Wo Contrast  Result Date: 04/22/2017 CLINICAL DATA:  Initial evaluation for acute altered mental status, recent falls. Question possible stroke on previous CT. EXAM: MRI HEAD WITHOUT CONTRAST MRA HEAD WITHOUT CONTRAST  TECHNIQUE: Multiplanar, multiecho pulse sequences of the brain and surrounding structures were obtained without intravenous contrast. Angiographic images of the head were obtained using MRA technique without contrast. COMPARISON:  Prior CT from 04/15/2017. FINDINGS: MRI HEAD FINDINGS Brain: Study mildly degraded by motion artifact. Mild diffuse age-related cerebral atrophy. Patchy T2/FLAIR hyperintensity within the periventricular white matter most consistent with chronic microvascular ischemic disease, mild for age. There is abnormal restricted diffusion involving the cortical gray matter and underlying subcortical and deep white matter of the bilateral frontal and parietal lobes, left greater than right (series 100, image 39) this extends in a somewhat linear fashion, suggesting watershed type infarcts. Additional patchy areas of infarction present within the bilateral occipital lobes (series 100, image 30, 24). No associated hemorrhage or mass effect. No other areas of acute or subacute infarction. No acute or chronic intracranial hemorrhage. No mass lesion, midline shift, or mass effect. No hydrocephalus. No extra-axial fluid collection. Major dural sinuses are grossly patent. Pituitary gland suprasellar region normal. Midline structures intact and normal. Vascular: Major intracranial vascular flow voids are maintained. Skull and upper cervical spine: Craniocervical junction within normal limits. Bone marrow signal intensity normal. No scalp soft tissue abnormality. Sinuses/Orbits: Globes and orbital soft tissues within normal limits. Mild scattered mucosal thickening within the ethmoidal air cells. Paranasal sinuses are otherwise clear. Small left mastoid effusion noted, of doubtful significance. Inner ear structures normal. Other: None. MRA HEAD FINDINGS ANTERIOR CIRCULATION: Study moderately degraded by motion artifact, limiting evaluation of the mid and distal vasculature. Distal cervical segments of the  internal carotid arteries patent at the skull base. Petrous, cavernous, and supraclinoid segments patent without flow-limiting stenosis. ICA termini widely patent. A1 segments patent bilaterally. Grossly normal  anterior communicating artery. Anterior cerebral arteries grossly patent to their distal aspects, although limited evaluation due to motion. Moderate atheromatous irregularity involving the ACA is suspected. M1 segments patent without obvious stenosis. Normal MCA bifurcations. No appreciable proximal M2 occlusion. Distal MCA branches not well evaluated on this motion degraded exam, but are grossly patent. POSTERIOR CIRCULATION: Vertebral arteries patent to the vertebrobasilar junction. Basilar artery patent to its distal aspect without flow-limiting stenosis. Superior cerebral arteries patent proximally. Left PCA supplied primarily via the basilar. Hypoplastic right P1 segment with prominent right posterior communicating artery. PCAs demonstrate moderate atheromatous irregularity with moderate to severe multifocal bilateral P2 stenoses. No appreciable aneurysm. IMPRESSION: MRI HEAD IMPRESSION: 1. 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 2. Mild age-related cerebral atrophy with chronic small vessel ischemic disease. MRA HEAD IMPRESSION: 1. Motion degraded exam. 2. No large or proximal arterial branch occlusion. No proximal or high-grade correctable stenosis identified. 3. Mild to moderate intracranial atherosclerotic change, most notable within the PCAs bilaterally. Electronically Signed   By: Rise Mu M.D.   On: 04/22/2017 01:50   US Carotid Bilateral (at [facility] Only)  Result Date: 04/22/2017 CLINICAL DATA:  CVA EXAM: BILATERAL CAROTID DUPLEX ULTRASOUND TECHNIQUE: Wallace Cullens scale imaging, color Doppler and duplex ultrasound were performed of bilateral carotid  and vertebral arteries in the neck. COMPARISON:  None. FINDINGS: Criteria: Quantification of carotid stenosis is based on velocity parameters that correlate the residual internal carotid diameter with NASCET-based stenosis levels, using the diameter of the distal internal carotid lumen as the denominator for stenosis measurement. The following velocity measurements were obtained: RIGHT ICA:  95 cm/sec CCA:  142 cm/sec SYSTOLIC ICA/CCA RATIO:  0.7 DIASTOLIC ICA/CCA RATIO:  1.9 ECA:  249 cm/sec LEFT ICA:  107 cm/sec CCA:  134 cm/sec SYSTOLIC ICA/CCA RATIO:  0.8 DIASTOLIC ICA/CCA RATIO:  2.2 ECA:  173 cm/sec RIGHT CAROTID ARTERY: Mild calcified plaque in the bulb. Low resistance internal carotid Doppler pattern. RIGHT VERTEBRAL ARTERY:  Antegrade. LEFT CAROTID ARTERY: Moderate irregular calcified plaque in the bulb. Low resistance internal carotid Doppler pattern is preserved. Calcified plaque does extend into the internal carotid artery. LEFT VERTEBRAL ARTERY:  Antegrade. IMPRESSION: Less than 50% stenosis in the right and left internal carotid arteries. Electronically Signed   By: Jolaine Click M.D.   On: 04/22/2017 08:59   Dg Chest Port 1 View  Result Date: 2017/05/11 CLINICAL DATA:  Suspect sepsis. EXAM: PORTABLE CHEST 1 VIEW COMPARISON:  None. FINDINGS: The heart size and mediastinal contours are within normal limits. Both lungs are clear. The visualized skeletal structures are unremarkable. IMPRESSION: No active disease. Electronically Signed   By: Signa Kell M.D.   On: 2017/05/11 11:27   Mr Maxine Glenn Head/brain WU Cm  Result Date: 04/22/2017 CLINICAL DATA:  Initial evaluation for acute altered mental status, recent falls. Question possible stroke on previous CT. EXAM: MRI HEAD WITHOUT CONTRAST MRA HEAD WITHOUT CONTRAST TECHNIQUE: Multiplanar, multiecho pulse sequences of the brain and surrounding structures were obtained without intravenous contrast. Angiographic images of the head were obtained using MRA  technique without contrast. COMPARISON:  Prior CT from 2017-05-11. FINDINGS: MRI HEAD FINDINGS Brain: Study mildly degraded by motion artifact. Mild diffuse age-related cerebral atrophy. Patchy T2/FLAIR hyperintensity within the periventricular white matter most consistent with chronic microvascular ischemic disease, mild for age. There is abnormal restricted diffusion involving the cortical gray matter and underlying subcortical and deep white  matter of the bilateral frontal and parietal lobes, left greater than right (series 100, image 39) this extends in a somewhat linear fashion, suggesting watershed type infarcts. Additional patchy areas of infarction present within the bilateral occipital lobes (series 100, image 30, 24). No associated hemorrhage or mass effect. No other areas of acute or subacute infarction. No acute or chronic intracranial hemorrhage. No mass lesion, midline shift, or mass effect. No hydrocephalus. No extra-axial fluid collection. Major dural sinuses are grossly patent. Pituitary gland suprasellar region normal. Midline structures intact and normal. Vascular: Major intracranial vascular flow voids are maintained. Skull and upper cervical spine: Craniocervical junction within normal limits. Bone marrow signal intensity normal. No scalp soft tissue abnormality. Sinuses/Orbits: Globes and orbital soft tissues within normal limits. Mild scattered mucosal thickening within the ethmoidal air cells. Paranasal sinuses are otherwise clear. Small left mastoid effusion noted, of doubtful significance. Inner ear structures normal. Other: None. MRA HEAD FINDINGS ANTERIOR CIRCULATION: Study moderately degraded by motion artifact, limiting evaluation of the mid and distal vasculature. Distal cervical segments of the internal carotid arteries patent at the skull base. Petrous, cavernous, and supraclinoid segments patent without flow-limiting stenosis. ICA termini widely patent. A1 segments patent  bilaterally. Grossly normal anterior communicating artery. Anterior cerebral arteries grossly patent to their distal aspects, although limited evaluation due to motion. Moderate atheromatous irregularity involving the ACA is suspected. M1 segments patent without obvious stenosis. Normal MCA bifurcations. No appreciable proximal M2 occlusion. Distal MCA branches not well evaluated on this motion degraded exam, but are grossly patent. POSTERIOR CIRCULATION: Vertebral arteries patent to the vertebrobasilar junction. Basilar artery patent to its distal aspect without flow-limiting stenosis. Superior cerebral arteries patent proximally. Left PCA supplied primarily via the basilar. Hypoplastic right P1 segment with prominent right posterior communicating artery. PCAs demonstrate moderate atheromatous irregularity with moderate to severe multifocal bilateral P2 stenoses. No appreciable aneurysm. IMPRESSION: MRI HEAD IMPRESSION: 1. 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 2. Mild age-related cerebral atrophy with chronic small vessel ischemic disease. MRA HEAD IMPRESSION: 1. Motion degraded exam. 2. No large or proximal arterial branch occlusion. No proximal or high-grade correctable stenosis identified. 3. Mild to moderate intracranial atherosclerotic change, most notable within the PCAs bilaterally. Electronically Signed   By: Rise Mu M.D.   On: 04/22/2017 01:50    EKG:   Orders placed or performed during the hospital encounter of 2017/05/15  . ED EKG 12-Lead  . ED EKG 12-Lead    ASSESSMENT AND PLAN:    66 year old female with a history of diabetes and essential hypertension who has had multiple falls over the past several days and now presents to the emergency room with confusion and right-sided weakness.  1. Sepsis: Patient presents with leukocytosis,  tachypnea, tachycardia and elevated lactic acid UA is abnml, urine cx pending   Chest x-ray without pneumonia Continue Zosyn and vanc empirically Follow up  blood cultures with gm pos cocci  ID consulted CT of the face is ordered for right maxillary swelling , possible parotitis and ENT consult placed   2. Acute/subacute right cerebellar infarct: MRI/MRA multiple small acute infarcts in the cerebral hemispheres bilaterally echocardiogram -pending  carotid Doppler less than 50% stenosis pending aspirin rectal suppositories N.p.o. until bedside swallow evaluation is done and speech therapy evaluation Check lipid panel and A1c PT, OTconsultation requested Neurology is following  3. Acute encephalopathy in the setting of sepsis with possible cerebellar  infarct and hypernatremia Continue to monitor mental status,neuro checks ABGs with hypoxia-secondary to poor inspiratory effort.  Provide oxygen via nasal cannula  4. Hypernatremia With hypokalemia this is due to poor by mouth intake over the past several days with dehydration Monitor sodium level Patient has received IV fluids normal saline  D5W with potassium supplements Serial sodium checks Nephrology consult  5. Diabetes: Close monitoring blood sugars on D5 Diabetes nurse management consultation requested Sliding scale ordered.  6. Acute kidney injury in the setting of dehydration and poor by mouth intake Stop nephrotoxic medications at this time Continue IV fluids and repeat BMP in a.m.  7. Essential hypertension: Due to acute kidney injury will hold ACE inhibitor When necessary hydralazine ordered  8. Urinary incontinence: The patient is n.p.o.  9. Hyperlipidemia: Continue statin once patient is more awake and alert and tolerates p.o.   Condition guarded with poor prognosis    All the records are reviewed and case discussed with Care Management/Social Workerr. Management plans discussed with the patient,  family members including son, daughter-in-law and brother at bedside and they are in agreement.  CODE STATUS: fc   TOTAL TIME TAKING CARE OF THIS PATIENT: 42 minutes.   POSSIBLE D/C IN ?  DAYS, DEPENDING ON CLINICAL CONDITION.  Note: This dictation was prepared with Dragon dictation along with smaller phrase technology. Any transcriptional errors that result from this process are unintentional.   Ramonita LabAruna Jermiah Soderman M.D on 04/22/2017 at 3:11 PM  Between 7am to 6pm - Pager - [phone] After 6pm go to [email or web address] - password [email or web address] [facility]  [facility]  Office  [phone]  CC: Primary care physician; [facility], [facility]

## 2017-04-23 ENCOUNTER — Inpatient Hospital Stay: Payer: Medicare HMO

## 2017-04-23 DIAGNOSIS — I6310 Cerebral infarction due to embolism of unspecified precerebral artery: Secondary | ICD-10-CM

## 2017-04-23 DIAGNOSIS — R0603 Acute respiratory distress: Secondary | ICD-10-CM

## 2017-04-23 LAB — CBC
HCT: 41.1 % (ref 35.0–47.0)
HEMOGLOBIN: 13 g/dL (ref 12.0–16.0)
MCH: 26.6 pg (ref 26.0–34.0)
MCHC: 31.8 g/dL — ABNORMAL LOW (ref 32.0–36.0)
MCV: 83.9 fL (ref 80.0–100.0)
PLATELETS: 284 10*3/uL (ref 150–440)
RBC: 4.9 MIL/uL (ref 3.80–5.20)
RDW: 15.9 % — ABNORMAL HIGH (ref 11.5–14.5)
WBC: 9.2 10*3/uL (ref 3.6–11.0)

## 2017-04-23 LAB — BASIC METABOLIC PANEL
ANION GAP: 13 (ref 5–15)
BUN: 34 mg/dL — ABNORMAL HIGH (ref 6–20)
CHLORIDE: 113 mmol/L — AB (ref 101–111)
CO2: 25 mmol/L (ref 22–32)
CREATININE: 0.93 mg/dL (ref 0.44–1.00)
Calcium: 8.9 mg/dL (ref 8.9–10.3)
GFR calc non Af Amer: >60 mL/min (ref >60)
Glucose, Bld: 228 mg/dL — ABNORMAL HIGH (ref 65–99)
Potassium: 3.5 mmol/L (ref 3.5–5.1)
SODIUM: 151 mmol/L — AB (ref 135–145)

## 2017-04-23 LAB — URINE CULTURE: SPECIAL REQUESTS: NORMAL

## 2017-04-23 LAB — GLUCOSE, CAPILLARY
GLUCOSE-CAPILLARY: 167 mg/dL — AB (ref 65–99)
GLUCOSE-CAPILLARY: 153 mg/dL — AB (ref 65–99)
Glucose-Capillary: 199 mg/dL — ABNORMAL HIGH (ref 65–99)
Glucose-Capillary: 215 mg/dL — ABNORMAL HIGH (ref 65–99)
Glucose-Capillary: 179 mg/dL — ABNORMAL HIGH (ref 65–99)

## 2017-04-23 LAB — BLOOD GAS, ARTERIAL
Acid-Base Excess: 5.7 mmol/L — ABNORMAL HIGH (ref 0.0–2.0)
BICARBONATE: 28.6 mmol/L — AB (ref 20.0–28.0)
FIO2: 0.28
O2 SAT: 91.9 %
PATIENT TEMPERATURE: 37
PO2 ART: 56 mmHg — AB (ref 83.0–108.0)
pCO2 arterial: 35 mmHg (ref 32.0–48.0)
pH, Arterial: 7.52 — ABNORMAL HIGH (ref 7.350–7.450)

## 2017-04-23 LAB — SODIUM
Sodium: 153 mmol/L — ABNORMAL HIGH (ref 135–145)
Sodium: 154 mmol/L — ABNORMAL HIGH (ref 135–145)

## 2017-04-23 LAB — HIV ANTIBODY (ROUTINE TESTING W REFLEX): HIV Screen 4th Generation wRfx: NONREACTIVE

## 2017-04-23 LAB — TROPONIN I: Troponin I: 0.06 ng/mL (ref <0.03)

## 2017-04-23 MED ORDER — KETOROLAC TROMETHAMINE 30 MG/ML IJ SOLN
15.0000 mg | Freq: Four times a day (QID) | INTRAMUSCULAR | Status: DC | PRN
  Administered 2017-04-23 – 2017-04-24 (×3): 15 mg via INTRAVENOUS
  Filled 2017-04-23 (×3): qty 1

## 2017-04-23 MED ORDER — POTASSIUM CL IN DEXTROSE 5% 20 MEQ/L IV SOLN
20.0000 meq | INTRAVENOUS | Status: DC
  Administered 2017-04-23: 20 meq via INTRAVENOUS
  Filled 2017-04-23 (×4): qty 1000

## 2017-04-23 MED ORDER — VANCOMYCIN HCL IN DEXTROSE 1-5 GM/200ML-% IV SOLN
1000.0000 mg | Freq: Two times a day (BID) | INTRAVENOUS | Status: DC
  Administered 2017-04-23 – 2017-04-24 (×2): 1000 mg via INTRAVENOUS
  Filled 2017-04-23 (×3): qty 200

## 2017-04-23 MED ORDER — POTASSIUM PHOSPHATES 15 MMOLE/5ML IV SOLN
7.5000 mmol | Freq: Once | INTRAVENOUS | Status: AC
  Administered 2017-04-23: 12:00:00 7.5 mmol via INTRAVENOUS
  Filled 2017-04-23: qty 2.5

## 2017-04-23 MED ORDER — METOPROLOL TARTRATE 5 MG/5ML IV SOLN
5.0000 mg | Freq: Four times a day (QID) | INTRAVENOUS | Status: DC
  Administered 2017-04-23 – 2017-04-24 (×2): 5 mg via INTRAVENOUS
  Filled 2017-04-23 (×2): qty 5

## 2017-04-23 MED ORDER — IPRATROPIUM-ALBUTEROL 0.5-2.5 (3) MG/3ML IN SOLN
3.0000 mL | Freq: Once | RESPIRATORY_TRACT | Status: AC
  Administered 2017-04-23: 3 mL via RESPIRATORY_TRACT
  Filled 2017-04-23: qty 3

## 2017-04-23 NOTE — Consult Note (Addendum)
[facility] Medicine Consultation    ASSESSMENT/PLAN   Respiratory distress. Secondary to altered mental status, secondary to multiple CVAs, difficulty clearing and managing secretions, possible superimposed aspiration although chest x-ray did not reveal clear parenchymal infiltrate. Concerning breathing pattern. Discussed with family who restate patient is a DO NOT RESUSCITATE and she would not want to be intubated and placed on mechanical ventilation. We'll of course abide by their wishes, she is not a bipap candidate, agree with high flow, suction as needed, infectious disease is following and adjusting antibiotic selection. No additional pulmonary recommendations at this time  Hypernatremia. Is being followed by nephrology, sodium has decreased from 155-151 presently on D5W   Prerenal azotemia  Staph sepsis. Being followed by infectious disease. Both Escherichia coli and staph or sensitive to cefazolin which is her present regimen.  INTAKE / OUTPUT:  Intake/Output Summary (Last 24 hours) at 04/23/2017 1142 Last data filed at 04/23/2017 0450 Gross per 24 hour  Intake 1178.75 ml  Output 900 ml  Net 278.75 ml   Name: Sierra Bass MRN: [id number] DOB: 11/20/1950    ADMISSION DATE:  September 29, 2017 CONSULTATION DATE:  04/23/2017  REFERRING MD :  Dr. Amado Bass  CHIEF COMPLAINT:  Respiratory distress   HISTORY OF PRESENT ILLNESS:  Sierra Bass is a 67 year old female with a past medical history remarkable for hypertension, diabetes who initially presented to the emergency department secondary to multiple falls with altered mental status. Neurology evaluated patient, she had an MRI of the brain which showed multiple small acute infarcts in the cerebral hemispheres bilaterally left greater than right. Embolic was felt to be etiology. Patient has also suffered from sepsis, has been seen by infectious disease, urine culture was positive for Escherichia coli and blood culture  was positive for MSSA. She also had facial swelling consistent with parotidis and has seen ENT. Was called today to evaluate patient after mental status has deteriorated and respiratory status has deteriorated. She is presently on high flow oxygen, breathing dyssynchronous late, central secretions noted.  Family made patient a DO NOT RESUSCITATE today  PAST MEDICAL HISTORY :  Past Medical History:  Diagnosis Date  . Diabetes mellitus without complication (HCC)   . Hypertension    Past Surgical History:  Procedure Laterality Date  . ABDOMINAL HYSTERECTOMY    . CESAREAN SECTION     Prior to Admission medications   Medication Sig Start Date End Date Taking? Authorizing Provider  gabapentin (NEURONTIN) 600 MG tablet Take 600 mg by mouth at bedtime.  11/27/16  Yes [provider]  lisinopril (PRINIVIL,ZESTRIL) 40 MG tablet Take 1 tablet by mouth daily. 03/02/17  Yes [provider]  meloxicam (MOBIC) 15 MG tablet Take 1 tablet by mouth daily. 12/29/16  Yes [provider]  metFORMIN (GLUCOPHAGE) 1000 MG tablet Take 1 tablet by mouth 2 (two) times daily. 12/06/16  Yes [provider]  oxybutynin (DITROPAN-XL) 10 MG 24 hr tablet Take 10 mg by mouth 3 (three) times daily.  02/23/17  Yes [provider]  pramipexole (MIRAPEX) 1.5 MG tablet Take 1 tablet by mouth daily. 01/03/17  Yes [provider]  simvastatin (ZOCOR) 10 MG tablet Take 1 tablet by mouth daily. 12/23/16  Yes [provider]  venlafaxine XR (EFFEXOR-XR) 150 MG 24 hr capsule Take 2 capsules by mouth daily. 11/27/16  Yes [provider]   Allergies  Allergen Reactions  . Abilify [Aripiprazole]     Tardive dyskinesia     FAMILY HISTORY:  Family History  Problem Relation Age of Onset  . Stroke Mother   . Cancer Father    SOCIAL HISTORY:  reports that she has been smoking cigarettes.  She has been smoking about 1.00 pack per day. she has never used smokeless  tobacco. She reports that she uses drugs. Drug: Marijuana. She reports that she does not drink alcohol.  REVIEW OF SYSTEMS:   Constitutional: Feels well. Cardiovascular: No chest pain.  Pulmonary: Denies dyspnea.   The remainder of systems were reviewed and were found to be negative other than what is documented in the HPI.    VITAL SIGNS: Temp:  [99.2 F (37.3 C)-101.3 F (38.5 C)] 101 F (38.3 C) (01/26 1021) Pulse Rate:  [80-94] 85 (01/26 0851) Resp:  [22-26] 22 (01/26 0851) BP: (116-149)/(52-75) 139/75 (01/26 0851) SpO2:  [94 %-98 %] 94 % (01/26 0851) HEMODYNAMICS:   VENTILATOR SETTINGS:   INTAKE / OUTPUT:  Intake/Output Summary (Last 24 hours) at 04/23/2017 1142 Last data filed at 04/23/2017 0450 Gross per 24 hour  Intake 1178.75 ml  Output 900 ml  Net 278.75 ml    Physical Examination:   VS: BP 139/75   Pulse 85   Temp (!) 101 F (38.3 C) (Axillary)   Resp (!) 22   Ht 5\' 2"  (1.575 m)   Wt 233 lb (105.7 kg)   SpO2 94%   BMI 42.62 kg/m   General Appearance: Patient is unresponsive, labored breathing with accessory muscle utilization and distress Neuro: Patient will only open eyes to agitation but no other response noted HEENT: Nasal high flow oxygen in place, trachea is midline, residual parotid swelling, no jugular venous distention, central secretions appreciated Pulmonary: Respiratory paradox in noted, basilar crackles, central secretions with decreased diaphragmatic excursion Cardiovascular tachycardia appreciated with systolic murmur left parasternal border. Rhythm is irregularly irregular   Abdomen: Soft exam, positive bowel sounds, labored breathing pattern Skin:   warm, no rashes, no ecchymosis  Extremities: Edema appreciated, no cyanosis noted or clubbing.    LABS: Reviewed   LABORATORY PANEL:   CBC Recent Labs  Lab 04/23/17 0748  WBC 9.2  HGB 13.0  HCT 41.1  PLT 284    Chemistries  Recent Labs  Lab 05-20-2017 1047  04/22/17 1647   04/23/17 0748  NA 155*   < > 155*   < > 151*  K 3.2*   < >  --    < > 3.5  CL 113*   < >  --   --  113*  CO2 25   < >  --   --  25  GLUCOSE 253*   < >  --   --  228*  BUN 55*   < >  --   --  34*  CREATININE 1.52*   < >  --   --  0.93  CALCIUM 10.1   < >  --   --  8.9  MG  --    < > 2.1  --   --   PHOS  --   --  2.3*  --   --   AST 57*  --   --   --   --   ALT 46  --   --   --   --   ALKPHOS 88  --   --   --   --   BILITOT 1.3*  --   --   --   --    < > = values  in this interval not displayed.    Recent Labs  Lab 04/13/2017 2104 04/22/17 1144 04/22/17 1704 04/22/17 2102 04/23/17 0734 04/23/17 0921  GLUCAP 173* 190* 154* 178* 199* 215*   Recent Labs  Lab 04/22/17 1124 04/23/17 0950  PHART 7.46* 7.52*  PCO2ART 41 35  PO2ART 67* 56*   Recent Labs  Lab 04/13/2017 1047  AST 57*  ALT 46  ALKPHOS 88  BILITOT 1.3*  ALBUMIN 3.5    Cardiac Enzymes Recent Labs  Lab 04/22/17 0243  TROPONINI 0.05*    RADIOLOGY:  Ct Head Wo Contrast  Result Date: 04/22/2017 CLINICAL DATA:  Stroke follow-up.  Multiple falls. EXAM: CT HEAD WITHOUT CONTRAST TECHNIQUE: Contiguous axial images were obtained from the base of the skull through the vertex without intravenous contrast. COMPARISON:  Brain MRI 04/22/2017 Head CT 04/19/2017 FINDINGS: Brain: Multifocal hypoattenuation corresponding to areas of ischemia in the left frontal lobe, right parietal lobe and left occipital lobe. No acute hemorrhage. No mass effect. Brain parenchyma and CSF-containing spaces are normal for age. Vascular: No hyperdense vessel or unexpected calcification. Skull: Normal visualized skull base, calvarium and extracranial soft tissues. Sinuses/Orbits: No sinus fluid levels or advanced mucosal thickening. No mastoid effusion. Normal orbits. IMPRESSION: 1. Expected evolution of density changes at the multiple sites of ischemia demonstrated on recent MRI. 2. No hemorrhage or mass effect. Electronically Signed   By: Deatra Robinson M.D.   On: 04/22/2017 18:42   Mr Brain Wo Contrast  Result Date: 04/22/2017 CLINICAL DATA:  Initial evaluation for acute altered mental status, recent falls. Question possible stroke on previous CT. EXAM: MRI HEAD WITHOUT CONTRAST MRA HEAD WITHOUT CONTRAST TECHNIQUE: Multiplanar, multiecho pulse sequences of the brain and surrounding structures were obtained without intravenous contrast. Angiographic images of the head were obtained using MRA technique without contrast. COMPARISON:  Prior CT from 04/18/2017. FINDINGS: MRI HEAD FINDINGS Brain: Study mildly degraded by motion artifact. Mild diffuse age-related cerebral atrophy. Patchy T2/FLAIR hyperintensity within the periventricular white matter most consistent with chronic microvascular ischemic disease, mild for age. There is abnormal restricted diffusion involving the cortical gray matter and underlying subcortical and deep white matter of the bilateral frontal and parietal lobes, left greater than right (series 100, image 39) this extends in a somewhat linear fashion, suggesting watershed type infarcts. Additional patchy areas of infarction present within the bilateral occipital lobes (series 100, image 30, 24). No associated hemorrhage or mass effect. No other areas of acute or subacute infarction. No acute or chronic intracranial hemorrhage. No mass lesion, midline shift, or mass effect. No hydrocephalus. No extra-axial fluid collection. Major dural sinuses are grossly patent. Pituitary gland suprasellar region normal. Midline structures intact and normal. Vascular: Major intracranial vascular flow voids are maintained. Skull and upper cervical spine: Craniocervical junction within normal limits. Bone marrow signal intensity normal. No scalp soft tissue abnormality. Sinuses/Orbits: Globes and orbital soft tissues within normal limits. Mild scattered mucosal thickening within the ethmoidal air cells. Paranasal sinuses are otherwise clear. Small left  mastoid effusion noted, of doubtful significance. Inner ear structures normal. Other: None. MRA HEAD FINDINGS ANTERIOR CIRCULATION: Study moderately degraded by motion artifact, limiting evaluation of the mid and distal vasculature. Distal cervical segments of the internal carotid arteries patent at the skull base. Petrous, cavernous, and supraclinoid segments patent without flow-limiting stenosis. ICA termini widely patent. A1 segments patent bilaterally. Grossly normal anterior communicating artery. Anterior cerebral arteries grossly patent to their distal aspects, although limited evaluation due to motion. Moderate atheromatous irregularity  involving the ACA is suspected. M1 segments patent without obvious stenosis. Normal MCA bifurcations. No appreciable proximal M2 occlusion. Distal MCA branches not well evaluated on this motion degraded exam, but are grossly patent. POSTERIOR CIRCULATION: Vertebral arteries patent to the vertebrobasilar junction. Basilar artery patent to its distal aspect without flow-limiting stenosis. Superior cerebral arteries patent proximally. Left PCA supplied primarily via the basilar. Hypoplastic right P1 segment with prominent right posterior communicating artery. PCAs demonstrate moderate atheromatous irregularity with moderate to severe multifocal bilateral P2 stenoses. No appreciable aneurysm. IMPRESSION: MRI HEAD IMPRESSION: 1. 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 2. Mild age-related cerebral atrophy with chronic small vessel ischemic disease. MRA HEAD IMPRESSION: 1. Motion degraded exam. 2. No large or proximal arterial branch occlusion. No proximal or high-grade correctable stenosis identified. 3. Mild to moderate intracranial atherosclerotic change, most notable within the PCAs bilaterally. Electronically Signed   By: Rise Mu  M.D.   On: 04/22/2017 01:50   US Carotid Bilateral (at [facility] Only)  Result Date: 04/22/2017 CLINICAL DATA:  CVA EXAM: BILATERAL CAROTID DUPLEX ULTRASOUND TECHNIQUE: Wallace Cullens scale imaging, color Doppler and duplex ultrasound were performed of bilateral carotid and vertebral arteries in the neck. COMPARISON:  None. FINDINGS: Criteria: Quantification of carotid stenosis is based on velocity parameters that correlate the residual internal carotid diameter with NASCET-based stenosis levels, using the diameter of the distal internal carotid lumen as the denominator for stenosis measurement. The following velocity measurements were obtained: RIGHT ICA:  95 cm/sec CCA:  142 cm/sec SYSTOLIC ICA/CCA RATIO:  0.7 DIASTOLIC ICA/CCA RATIO:  1.9 ECA:  249 cm/sec LEFT ICA:  107 cm/sec CCA:  134 cm/sec SYSTOLIC ICA/CCA RATIO:  0.8 DIASTOLIC ICA/CCA RATIO:  2.2 ECA:  173 cm/sec RIGHT CAROTID ARTERY: Mild calcified plaque in the bulb. Low resistance internal carotid Doppler pattern. RIGHT VERTEBRAL ARTERY:  Antegrade. LEFT CAROTID ARTERY: Moderate irregular calcified plaque in the bulb. Low resistance internal carotid Doppler pattern is preserved. Calcified plaque does extend into the internal carotid artery. LEFT VERTEBRAL ARTERY:  Antegrade. IMPRESSION: Less than 50% stenosis in the right and left internal carotid arteries. Electronically Signed   By: Jolaine Click M.D.   On: 04/22/2017 08:59   Dg Chest Port 1 View  Result Date: 04/23/2017 CLINICAL DATA:  Hypoxic respiratory failure and sepsis. EXAM: PORTABLE CHEST 1 VIEW COMPARISON:  04/13/2017 FINDINGS: Stable top-normal heart size. Lung volumes are very low bilaterally. No focal airspace consolidation or pleural fluid identified. No evidence of pneumothorax. Possible component of large hiatal hernia. IMPRESSION: Low bilateral lung volumes.  Possible large hiatal hernia. Electronically Signed   By: Irish Lack M.D.   On: 04/23/2017 11:14   Mr Maxine Glenn Head/brain UE  Cm  Result Date: 04/22/2017 CLINICAL DATA:  Initial evaluation for acute altered mental status, recent falls. Question possible stroke on previous CT. EXAM: MRI HEAD WITHOUT CONTRAST MRA HEAD WITHOUT CONTRAST TECHNIQUE: Multiplanar, multiecho pulse sequences of the brain and surrounding structures were obtained without intravenous contrast. Angiographic images of the head were obtained using MRA technique without contrast. COMPARISON:  Prior CT from 04/12/2017. FINDINGS: MRI HEAD FINDINGS Brain: Study mildly degraded by motion artifact. Mild diffuse age-related cerebral atrophy. Patchy T2/FLAIR hyperintensity within the periventricular white matter most consistent with chronic microvascular ischemic disease, mild for age. There is abnormal restricted diffusion involving the cortical gray matter and underlying subcortical and deep white matter of the bilateral  frontal and parietal lobes, left greater than right (series 100, image 39) this extends in a somewhat linear fashion, suggesting watershed type infarcts. Additional patchy areas of infarction present within the bilateral occipital lobes (series 100, image 30, 24). No associated hemorrhage or mass effect. No other areas of acute or subacute infarction. No acute or chronic intracranial hemorrhage. No mass lesion, midline shift, or mass effect. No hydrocephalus. No extra-axial fluid collection. Major dural sinuses are grossly patent. Pituitary gland suprasellar region normal. Midline structures intact and normal. Vascular: Major intracranial vascular flow voids are maintained. Skull and upper cervical spine: Craniocervical junction within normal limits. Bone marrow signal intensity normal. No scalp soft tissue abnormality. Sinuses/Orbits: Globes and orbital soft tissues within normal limits. Mild scattered mucosal thickening within the ethmoidal air cells. Paranasal sinuses are otherwise clear. Small left mastoid effusion noted, of doubtful significance. Inner  ear structures normal. Other: None. MRA HEAD FINDINGS ANTERIOR CIRCULATION: Study moderately degraded by motion artifact, limiting evaluation of the mid and distal vasculature. Distal cervical segments of the internal carotid arteries patent at the skull base. Petrous, cavernous, and supraclinoid segments patent without flow-limiting stenosis. ICA termini widely patent. A1 segments patent bilaterally. Grossly normal anterior communicating artery. Anterior cerebral arteries grossly patent to their distal aspects, although limited evaluation due to motion. Moderate atheromatous irregularity involving the ACA is suspected. M1 segments patent without obvious stenosis. Normal MCA bifurcations. No appreciable proximal M2 occlusion. Distal MCA branches not well evaluated on this motion degraded exam, but are grossly patent. POSTERIOR CIRCULATION: Vertebral arteries patent to the vertebrobasilar junction. Basilar artery patent to its distal aspect without flow-limiting stenosis. Superior cerebral arteries patent proximally. Left PCA supplied primarily via the basilar. Hypoplastic right P1 segment with prominent right posterior communicating artery. PCAs demonstrate moderate atheromatous irregularity with moderate to severe multifocal bilateral P2 stenoses. No appreciable aneurysm. IMPRESSION: MRI HEAD IMPRESSION: 1. 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 2. Mild age-related cerebral atrophy with chronic small vessel ischemic disease. MRA HEAD IMPRESSION: 1. Motion degraded exam. 2. No large or proximal arterial branch occlusion. No proximal or high-grade correctable stenosis identified. 3. Mild to moderate intracranial atherosclerotic change, most notable within the PCAs bilaterally. Electronically Signed   By: Rise Mu M.D.   On: 04/22/2017 01:50    Tora Kindred,  DO  04/23/2017, 11:42 AM

## 2017-04-23 NOTE — Consult Note (Signed)
MEDICATION RELATED CONSULT NOTE   Pharmacy Consult for electrolytes Indication: hypokalemia  Allergies  Allergen Reactions  . Abilify [Aripiprazole]     Tardive dyskinesia     Patient Measurements: Height: 5\' 2"  (157.5 cm) Weight: 233 lb (105.7 kg) IBW/kg (Calculated) : 50.1 Adjusted Body Weight:   Vital Signs: Temp: 101.3 F (38.5 C) (01/26 0900) Temp Source: Oral (01/26 0900) BP: 139/75 (01/26 0851) Pulse Rate: 85 (01/26 0851) Intake/Output from previous day: 01/25 0701 - 01/26 0700 In: 1178.8 [I.V.:978.8; IV Piggyback:200] Out: 900 [Urine:900] Intake/Output from this shift: No intake/output data recorded.  Labs: Recent Labs    04/05/2017 1047 04/07/2017 1838 04/22/17 1125 04/22/17 1647 04/23/17 0748  WBC 13.9*  --  7.5  --  9.2  HGB 15.3  --  13.5  --  13.0  HCT 47.9*  --  42.3  --  41.1  PLT 347  --  295  --  284  APTT 26  --   --   --   --   CREATININE 1.52*  --  0.89  --  0.93  MG  --  2.2  --  2.1  --   PHOS  --   --   --  2.3*  --   ALBUMIN 3.5  --   --   --   --   PROT 7.5  --   --   --   --   AST 57*  --   --   --   --   ALT 46  --   --   --   --   ALKPHOS 88  --   --   --   --   BILITOT 1.3*  --   --   --   --    Estimated Creatinine Clearance: 68.8 mL/min (by C-G formula based on SCr of 0.93 mg/dL).   Assessment: Patient is a 67 year old female with severe malnutrition, no po intake for 1 week, sepsis, CVA, encephalopathy. K this AM=2.8. Mg yesterday was 2.2. Prime Dr ordered KCL 10 MEQ IV x 4. Pt also has D5W w/ 20 MEQ KCL @ 5875ml/hr (1.5 MEQ/hr) hanging.  Goal of Therapy:  Normalization of electrolytes  Plan:  I have ordered an add on Mg and phos level. Replace these as needed. Continue with KCL IVPB runs x4. Recheck K @ 2100.  Addendum: add on Mg WNL, phos low. Will replace phos after K level results at 2100. K level will depend on how phos is replaced. Will need to be IV  1/26: Pt is NPO K 3.5 this AM Mag 2.1, Phos 2.3 yesterday - not  replaced Will order KPhos 7.5 mmol IV x1 for today Recheck labs in AM   Crist FatHannah Tayshon Winker, PharmD, BCPS Clinical Pharmacist 04/23/2017 9:29 AM

## 2017-04-23 NOTE — Progress Notes (Signed)
ANTIBIOTIC CONSULT NOTE - INITIAL  Pharmacy Consult for vancomycin Indication: sepsis  Allergies  Allergen Reactions  . Abilify [Aripiprazole]     Tardive dyskinesia     Patient Measurements: Height: 5\' 2"  (157.5 cm) Weight: 233 lb (105.7 kg) IBW/kg (Calculated) : 50.1 Adjusted Body Weight:   Vital Signs: Temp: 99.9 F (37.7 C) (01/26 1345) Temp Source: Axillary (01/26 1345) BP: 142/68 (01/26 1345) Pulse Rate: 85 (01/26 0851) Intake/Output from previous day: 01/25 0701 - 01/26 0700 In: 1178.8 [I.V.:978.8; IV Piggyback:200] Out: 900 [Urine:900] Intake/Output from this shift: No intake/output data recorded.  Labs: Recent Labs    04/05/2017 1047 04/22/17 1125 04/23/17 0748  WBC 13.9* 7.5 9.2  HGB 15.3 13.5 13.0  PLT 347 295 284  CREATININE 1.52* 0.89 0.93   Estimated Creatinine Clearance: 68.8 mL/min (by C-G formula based on SCr of 0.93 mg/dL). No results for input(s): VANCOTROUGH, VANCOPEAK, VANCORANDOM, GENTTROUGH, GENTPEAK, GENTRANDOM, TOBRATROUGH, TOBRAPEAK, TOBRARND, AMIKACINPEAK, AMIKACINTROU, AMIKACIN in the last 72 hours.   Microbiology: Recent Results (from the past 720 hour(s))  Blood Culture (routine x 2)     Status: Abnormal (Preliminary result)   Collection Time: 04/22/2017 10:48 AM  Result Value Ref Range Status   Specimen Description   Final    BLOOD RIGHT [facility] Performed at [facility], [address], [place], [place] [address]    Special Requests   Final    BOTTLES DRAWN AEROBIC AND ANAEROBIC Blood Culture adequate volume Performed at [facility], [address] [place], [place] [address]    Culture  Setup Time   Final    GRAM POSITIVE COCCI IN BOTH AEROBIC AND ANAEROBIC BOTTLES CRITICAL RESULT CALLED TO, READ BACK BY AND VERIFIED WITH: MATT MCBANE AT 0322 ON 04/22/17 [facility]. Performed at [facility], [address] [place], [place] [address]    Culture (A)  Final    STAPHYLOCOCCUS AUREUS SUSCEPTIBILITIES TO  FOLLOW Performed at [facility], [address] [place], [place] [address]    Report Status PENDING  Incomplete  Blood Culture (routine x 2)     Status: Abnormal (Preliminary result)   Collection Time: 04/02/2017 10:48 AM  Result Value Ref Range Status   Specimen Description   Final    BLOOD Blood Culture adequate volume Performed at [facility], [address] [place], [place] [address]    Special Requests   Final    BLOOD RIGHT HAND Performed at [facility], [address], [place], [place] [address]    Culture  Setup Time   Final    GRAM POSITIVE COCCI ANAEROBIC BOTTLE ONLY CRITICAL VALUE NOTED.  VALUE IS CONSISTENT WITH PREVIOUSLY REPORTED AND CALLED VALUE. Performed at [facility], [address] [place], [place] [address]    Culture STAPHYLOCOCCUS AUREUS (A)  Final   Report Status PENDING  Incomplete  Blood Culture ID Panel (Reflexed)     Status: Abnormal   Collection Time: 04/20/2017 10:48 AM  Result Value Ref Range Status   Enterococcus species NOT DETECTED NOT DETECTED Final   Vancomycin resistance NOT DETECTED NOT DETECTED Final   Listeria monocytogenes NOT DETECTED NOT DETECTED Final   Staphylococcus species DETECTED (A) NOT DETECTED Final    Comment: CRITICAL RESULT CALLED TO, READ BACK BY AND VERIFIED WITH: MATT MCBANE AT 0322 ON 04/22/17 [facility].    Staphylococcus aureus DETECTED (A) NOT DETECTED Final    Comment: Methicillin (oxacillin) susceptible Staphylococcus aureus (MSSA). Preferred therapy is anti staphylococcal beta  lactam antibiotic (Cefazolin or Nafcillin), unless clinically contraindicated. CRITICAL RESULT CALLED TO, READ BACK BY AND VERIFIED WITH: MATT MCBANE AT 0322 ON 04/22/17 [facility].    Methicillin resistance NOT DETECTED NOT DETECTED Final   Streptococcus species NOT DETECTED NOT DETECTED Final   Streptococcus agalactiae NOT DETECTED NOT DETECTED Final   Streptococcus pneumoniae NOT DETECTED NOT DETECTED Final    Streptococcus pyogenes NOT DETECTED NOT DETECTED Final   Acinetobacter baumannii NOT DETECTED NOT DETECTED Final   Enterobacteriaceae species NOT DETECTED NOT DETECTED Final   Enterobacter cloacae complex NOT DETECTED NOT DETECTED Final   Escherichia coli NOT DETECTED NOT DETECTED Final   Klebsiella oxytoca NOT DETECTED NOT DETECTED Final   Klebsiella pneumoniae NOT DETECTED NOT DETECTED Final   Proteus species NOT DETECTED NOT DETECTED Final   Serratia marcescens NOT DETECTED NOT DETECTED Final   Carbapenem resistance NOT DETECTED NOT DETECTED Final   Haemophilus influenzae NOT DETECTED NOT DETECTED Final   Neisseria meningitidis NOT DETECTED NOT DETECTED Final   Pseudomonas aeruginosa NOT DETECTED NOT DETECTED Final   Candida albicans NOT DETECTED NOT DETECTED Final   Candida glabrata NOT DETECTED NOT DETECTED Final   Candida krusei NOT DETECTED NOT DETECTED Final   Candida parapsilosis NOT DETECTED NOT DETECTED Final   Candida tropicalis NOT DETECTED NOT DETECTED Final    Comment: Performed at [facility], [address] [place], [place] [address]  Urine culture     Status: Abnormal   Collection Time: 04/02/2017 10:49 AM  Result Value Ref Range Status   Specimen Description   Final    URINE, RANDOM Performed at [facility], [address] [place], [place] [address]    Special Requests   Final    Normal Performed at [facility], [address] [place], [place] [address]    Culture >=100,000 COLONIES/mL ESCHERICHIA COLI (A)  Final   Report Status 04/23/2017 FINAL  Final   Organism ID, Bacteria ESCHERICHIA COLI (A)  Final      Susceptibility   Escherichia coli - MIC*    AMPICILLIN >=32 RESISTANT Resistant     CEFAZOLIN <=4 SENSITIVE Sensitive     CEFTRIAXONE <=1 SENSITIVE Sensitive     CIPROFLOXACIN >=4 RESISTANT Resistant     GENTAMICIN <=1 SENSITIVE Sensitive     IMIPENEM <=0.25 SENSITIVE Sensitive     NITROFURANTOIN <=16 SENSITIVE  Sensitive     TRIMETH/SULFA <=20 SENSITIVE Sensitive     AMPICILLIN/SULBACTAM 8 SENSITIVE Sensitive     PIP/TAZO <=4 SENSITIVE Sensitive     Extended ESBL NEGATIVE Sensitive     * >=100,000 COLONIES/mL ESCHERICHIA COLI  MRSA PCR Screening     Status: None   Collection Time: 04/01/2017  5:30 PM  Result Value Ref Range Status   MRSA by PCR NEGATIVE NEGATIVE Final    Comment:        The GeneXpert MRSA Assay (FDA approved for NASAL specimens only), is one component of a comprehensive MRSA colonization surveillance program. It is not intended to diagnose MRSA infection nor to guide or monitor treatment for MRSA infections. Performed at [facility], [address] [place], [place] [address]   Culture, blood (single) w Reflex to ID Panel     Status: None (Preliminary result)   Collection Time: 04/22/17  4:47 PM  Result Value Ref Range Status   Specimen Description BLOOD RIGHT ANTECUBITAL  Final   Special Requests   Final    BOTTLES DRAWN AEROBIC AND ANAEROBIC Blood Culture  adequate volume   Culture   Final    NO GROWTH < 24 HOURS Performed at [facility], [address] [place], [place] [address]    Report Status PENDING  Incomplete    Medical History: Past Medical History:  Diagnosis Date  . Diabetes mellitus without complication (HCC)   . Hypertension     Medications:  Infusions:  .  ceFAZolin (ANCEF) IV Stopped (04/23/17 1214)  . dextrose 5 % with KCl 20 mEq / L 20 mEq (04/23/17 1218)  . potassium PHOSPHATE IVPB (mmol) 7.5 mmol (04/23/17 1144)  . vancomycin     Assessment: 66 yof with falls/confusion. MRI with scattered acute strokes. UCx E. Coli and BCx MSSA. She received a few doses of vancomycin IP, then ID saw her and recommended switch to Ancef. Now hospitalist consults to restart vancomycin.  Goal of Therapy:  Vancomycin trough level 15-20 mcg/ml  Plan:  1. Add vancomycin 1 gm IV Q12H. Do not use load or stack due to recent  administration (>24hr). Pharmacy will continue to follow and adjust as needed to maintain trough 15 to 20 mcg/mL.   Vd 50.4 L, Ke 0.061 hr-1, T1/2 11.3 hr  Carola Frost, Pharm.D., BCPS Clinical Pharmacist 04/23/2017,3:41 PM

## 2017-04-23 NOTE — Progress Notes (Signed)
Family Meeting Note  Advance Directive:yes  Today a meeting took place with the Patient's son who is the healthcare power of attorney, daughter-in-law and 2 brothers in patient's room.  Patient is unable to participate because of metabolic encephalopathy and she is deeply obtunded   The following clinical team members were present during this meeting:MD, RN  The following were discussed:Patient's diagnosis: Acute hypoxi respiratory failure, severe sepsis, acute parotitis, metabolic encephalopathy, hyponatremia, multiple acute strokes Patient's progosis: Very poor, with high mortality and Goals for treatment: To continue IV fluids, IV antibiotics and to change the CODE STATUS from full code to DO NOT RESUSCITATE Additional follow-up to be provided: Hospitalist, infectious disease, nephrology and pulmonology consult is added  Time spent during discussion:20 minutes  Ramonita LabAruna Alveena Taira, MD

## 2017-04-23 NOTE — Progress Notes (Signed)
[facility] [place] at [facility]   PATIENT NAME: Sierra Bass    MR#:  [id number]  DATE OF BIRTH:  1950-06-03  SUBJECTIVE:  CHIEF COMPLAINT:  Pt is not arousable to verbal commands still, clinically deteriorating, hypoxemic c on blood gas,  patient's brother, son and daughter-in-law at bedside  REVIEW OF SYSTEMS:  Review of system unobtainable as the patient is encephalopathic  DRUG ALLERGIES:   Allergies  Allergen Reactions  . Abilify [Aripiprazole]     Tardive dyskinesia     VITALS:  Blood pressure (!) 142/68, pulse 85, temperature 99.9 F (37.7 C), temperature source Axillary, resp. rate (!) 24, height 5\' 2"  (1.575 m), weight 105.7 kg (233 lb), SpO2 93 %.  PHYSICAL EXAMINATION:  GENERAL:  67 y.o.-year-old patient lying in the bed with no acute distress.  EYES: Pupils equal, round, reactive to light and accommodation. No scleral icterus.  HEENT: Head atraumatic, normocephalic.  Right maxillary area is edematous, tender NECK:  Supple, no jugular venous distention. No thyroid enlargement, no tenderness.  LUNGS: Moderately diminished breath sounds bilaterally, no wheezing, rales,rhonchi or crepitation. No use of accessory muscles of respiration.  CARDIOVASCULAR: S1, S2 normal. No murmurs, rubs, or gallops.  ABDOMEN: Soft, nontender, nondistended. Bowel sounds present. No organomegaly or mass.  EXTREMITIES: No pedal edema, cyanosis, or clubbing.  NEUROLOGIC: obtunded PSYCHIATRIC: The patient is encephalopathic  SKIN: No obvious rash, lesion, or ulcer.    LABORATORY PANEL:   CBC Recent Labs  Lab 04/23/17 0748  WBC 9.2  HGB 13.0  HCT 41.1  PLT 284   ------------------------------------------------------------------------------------------------------------------  Chemistries  Recent Labs  Lab 04/01/2017 1047  04/22/17 1647  04/23/17 0748  NA 155*   < > 155*   < > 151*  K 3.2*   < >  --    < > 3.5  CL 113*   < >  --   --  113*  CO2 25    < >  --   --  25  GLUCOSE 253*   < >  --   --  228*  BUN 55*   < >  --   --  34*  CREATININE 1.52*   < >  --   --  0.93  CALCIUM 10.1   < >  --   --  8.9  MG  --    < > 2.1  --   --   AST 57*  --   --   --   --   ALT 46  --   --   --   --   ALKPHOS 88  --   --   --   --   BILITOT 1.3*  --   --   --   --    < > = values in this interval not displayed.   ------------------------------------------------------------------------------------------------------------------  Cardiac Enzymes Recent Labs  Lab 04/22/17 0243  TROPONINI 0.05*   ------------------------------------------------------------------------------------------------------------------  RADIOLOGY:  Ct Head Wo Contrast  Result Date: 04/22/2017 CLINICAL DATA:  Stroke follow-up.  Multiple falls. EXAM: CT HEAD WITHOUT CONTRAST TECHNIQUE: Contiguous axial images were obtained from the base of the skull through the vertex without intravenous contrast. COMPARISON:  Brain MRI 04/22/2017 Head CT 03/31/2017 FINDINGS: Brain: Multifocal hypoattenuation corresponding to areas of ischemia in the left frontal lobe, right parietal lobe and left occipital lobe. No acute hemorrhage. No mass effect. Brain parenchyma and CSF-containing spaces are normal for age. Vascular: No hyperdense vessel or unexpected calcification.  Skull: Normal visualized skull base, calvarium and extracranial soft tissues. Sinuses/Orbits: No sinus fluid levels or advanced mucosal thickening. No mastoid effusion. Normal orbits. IMPRESSION: 1. Expected evolution of density changes at the multiple sites of ischemia demonstrated on recent MRI. 2. No hemorrhage or mass effect. Electronically Signed   By: Deatra RobinsonKevin  Herman M.D.   On: 04/22/2017 18:42   Mr Brain Wo Contrast  Result Date: 04/22/2017 CLINICAL DATA:  Initial evaluation for acute altered mental status, recent falls. Question possible stroke on previous CT. EXAM: MRI HEAD WITHOUT CONTRAST MRA HEAD WITHOUT CONTRAST  TECHNIQUE: Multiplanar, multiecho pulse sequences of the brain and surrounding structures were obtained without intravenous contrast. Angiographic images of the head were obtained using MRA technique without contrast. COMPARISON:  Prior CT from 04/16/2017. FINDINGS: MRI HEAD FINDINGS Brain: Study mildly degraded by motion artifact. Mild diffuse age-related cerebral atrophy. Patchy T2/FLAIR hyperintensity within the periventricular white matter most consistent with chronic microvascular ischemic disease, mild for age. There is abnormal restricted diffusion involving the cortical gray matter and underlying subcortical and deep white matter of the bilateral frontal and parietal lobes, left greater than right (series 100, image 39) this extends in a somewhat linear fashion, suggesting watershed type infarcts. Additional patchy areas of infarction present within the bilateral occipital lobes (series 100, image 30, 24). No associated hemorrhage or mass effect. No other areas of acute or subacute infarction. No acute or chronic intracranial hemorrhage. No mass lesion, midline shift, or mass effect. No hydrocephalus. No extra-axial fluid collection. Major dural sinuses are grossly patent. Pituitary gland suprasellar region normal. Midline structures intact and normal. Vascular: Major intracranial vascular flow voids are maintained. Skull and upper cervical spine: Craniocervical junction within normal limits. Bone marrow signal intensity normal. No scalp soft tissue abnormality. Sinuses/Orbits: Globes and orbital soft tissues within normal limits. Mild scattered mucosal thickening within the ethmoidal air cells. Paranasal sinuses are otherwise clear. Small left mastoid effusion noted, of doubtful significance. Inner ear structures normal. Other: None. MRA HEAD FINDINGS ANTERIOR CIRCULATION: Study moderately degraded by motion artifact, limiting evaluation of the mid and distal vasculature. Distal cervical segments of the  internal carotid arteries patent at the skull base. Petrous, cavernous, and supraclinoid segments patent without flow-limiting stenosis. ICA termini widely patent. A1 segments patent bilaterally. Grossly normal anterior communicating artery. Anterior cerebral arteries grossly patent to their distal aspects, although limited evaluation due to motion. Moderate atheromatous irregularity involving the ACA is suspected. M1 segments patent without obvious stenosis. Normal MCA bifurcations. No appreciable proximal M2 occlusion. Distal MCA branches not well evaluated on this motion degraded exam, but are grossly patent. POSTERIOR CIRCULATION: Vertebral arteries patent to the vertebrobasilar junction. Basilar artery patent to its distal aspect without flow-limiting stenosis. Superior cerebral arteries patent proximally. Left PCA supplied primarily via the basilar. Hypoplastic right P1 segment with prominent right posterior communicating artery. PCAs demonstrate moderate atheromatous irregularity with moderate to severe multifocal bilateral P2 stenoses. No appreciable aneurysm. IMPRESSION: MRI HEAD IMPRESSION: 1. 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 2. Mild age-related cerebral atrophy with chronic small vessel ischemic disease. MRA HEAD IMPRESSION: 1. Motion degraded exam. 2. No large or proximal arterial branch occlusion. No proximal or high-grade correctable stenosis identified. 3. Mild to moderate intracranial atherosclerotic change, most notable within the PCAs bilaterally. Electronically Signed   By: Rise MuBenjamin  McClintock M.D.   On: 04/22/2017 01:50   [place] Carotid Bilateral (at [facility] Only)  Result Date: 04/22/2017 CLINICAL DATA:  CVA EXAM: BILATERAL CAROTID DUPLEX ULTRASOUND TECHNIQUE: Wallace Cullens scale imaging, color Doppler and duplex ultrasound were performed of bilateral carotid  and vertebral arteries in the neck. COMPARISON:  None. FINDINGS: Criteria: Quantification of carotid stenosis is based on velocity parameters that correlate the residual internal carotid diameter with NASCET-based stenosis levels, using the diameter of the distal internal carotid lumen as the denominator for stenosis measurement. The following velocity measurements were obtained: RIGHT ICA:  95 cm/sec CCA:  142 cm/sec SYSTOLIC ICA/CCA RATIO:  0.7 DIASTOLIC ICA/CCA RATIO:  1.9 ECA:  249 cm/sec LEFT ICA:  107 cm/sec CCA:  134 cm/sec SYSTOLIC ICA/CCA RATIO:  0.8 DIASTOLIC ICA/CCA RATIO:  2.2 ECA:  173 cm/sec RIGHT CAROTID ARTERY: Mild calcified plaque in the bulb. Low resistance internal carotid Doppler pattern. RIGHT VERTEBRAL ARTERY:  Antegrade. LEFT CAROTID ARTERY: Moderate irregular calcified plaque in the bulb. Low resistance internal carotid Doppler pattern is preserved. Calcified plaque does extend into the internal carotid artery. LEFT VERTEBRAL ARTERY:  Antegrade. IMPRESSION: Less than 50% stenosis in the right and left internal carotid arteries. Electronically Signed   By: Jolaine Click M.D.   On: 04/22/2017 08:59   Dg Chest Port 1 View  Result Date: 04/23/2017 CLINICAL DATA:  Hypoxic respiratory failure and sepsis. EXAM: PORTABLE CHEST 1 VIEW COMPARISON:  05-01-17 FINDINGS: Stable top-normal heart size. Lung volumes are very low bilaterally. No focal airspace consolidation or pleural fluid identified. No evidence of pneumothorax. Possible component of large hiatal hernia. IMPRESSION: Low bilateral lung volumes.  Possible large hiatal hernia. Electronically Signed   By: Irish Lack M.D.   On: 04/23/2017 11:14   Mr Maxine Glenn Head/brain WU Cm  Result Date: 04/22/2017 CLINICAL DATA:  Initial evaluation for acute altered mental status, recent falls. Question possible stroke on previous CT. EXAM: MRI HEAD WITHOUT CONTRAST MRA HEAD WITHOUT CONTRAST TECHNIQUE: Multiplanar, multiecho pulse sequences of the  brain and surrounding structures were obtained without intravenous contrast. Angiographic images of the head were obtained using MRA technique without contrast. COMPARISON:  Prior CT from 05/01/17. FINDINGS: MRI HEAD FINDINGS Brain: Study mildly degraded by motion artifact. Mild diffuse age-related cerebral atrophy. Patchy T2/FLAIR hyperintensity within the periventricular white matter most consistent with chronic microvascular ischemic disease, mild for age. There is abnormal restricted diffusion involving the cortical gray matter and underlying subcortical and deep white matter of the bilateral frontal and parietal lobes, left greater than right (series 100, image 39) this extends in a somewhat linear fashion, suggesting watershed type infarcts. Additional patchy areas of infarction present within the bilateral occipital lobes (series 100, image 30, 24). No associated hemorrhage or mass effect. No other areas of acute or subacute infarction. No acute or chronic intracranial hemorrhage. No mass lesion, midline shift, or mass effect. No hydrocephalus. No extra-axial fluid collection. Major dural sinuses are grossly patent. Pituitary gland suprasellar region normal. Midline structures intact and normal. Vascular: Major intracranial vascular flow voids are maintained. Skull and upper cervical spine: Craniocervical junction within normal limits. Bone marrow signal intensity normal. No scalp soft tissue abnormality. Sinuses/Orbits: Globes and orbital soft tissues within normal limits. Mild scattered mucosal thickening within the ethmoidal air cells. Paranasal sinuses are otherwise clear. Small left mastoid effusion noted, of doubtful significance. Inner ear structures normal. Other: None. MRA HEAD FINDINGS ANTERIOR CIRCULATION: Study moderately degraded by motion artifact, limiting evaluation of the mid and distal vasculature. Distal cervical segments of the internal carotid arteries patent at the skull base.  Petrous,  cavernous, and supraclinoid segments patent without flow-limiting stenosis. ICA termini widely patent. A1 segments patent bilaterally. Grossly normal anterior communicating artery. Anterior cerebral arteries grossly patent to their distal aspects, although limited evaluation due to motion. Moderate atheromatous irregularity involving the ACA is suspected. M1 segments patent without obvious stenosis. Normal MCA bifurcations. No appreciable proximal M2 occlusion. Distal MCA branches not well evaluated on this motion degraded exam, but are grossly patent. POSTERIOR CIRCULATION: Vertebral arteries patent to the vertebrobasilar junction. Basilar artery patent to its distal aspect without flow-limiting stenosis. Superior cerebral arteries patent proximally. Left PCA supplied primarily via the basilar. Hypoplastic right P1 segment with prominent right posterior communicating artery. PCAs demonstrate moderate atheromatous irregularity with moderate to severe multifocal bilateral P2 stenoses. No appreciable aneurysm. IMPRESSION: MRI HEAD IMPRESSION: 1. 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 2. Mild age-related cerebral atrophy with chronic small vessel ischemic disease. MRA HEAD IMPRESSION: 1. Motion degraded exam. 2. No large or proximal arterial branch occlusion. No proximal or high-grade correctable stenosis identified. 3. Mild to moderate intracranial atherosclerotic change, most notable within the PCAs bilaterally. Electronically Signed   By: Rise Mu M.D.   On: 04/22/2017 01:50    EKG:   Orders placed or performed during the hospital encounter of 05/17/17  . ED EKG 12-Lead  . ED EKG 12-Lead    ASSESSMENT AND PLAN:    67 year old female with a history of diabetes and essential hypertension who has had multiple falls over the past several days and now presents  to the emergency room with confusion and right-sided weakness.  #. Sepsis: Patient presents with leukocytosis, tachypnea, tachycardia and elevated lactic acid Urine culture with E. Coli Blood cultures from 05/17/17 gram-positive cocci Staphylococcus, repeat blood cultures from 04/22/2017 no growth so far Chest x-ray without pneumonia Patient was started on Zosyn and vancomycin empirically which was changed to Ancef and vancomycin ID recommendations appreciated CT of the face is ordered for right maxillary swelling , possible parotitis and ENT consult appreciated  echocardiogram is done and results are pending.  Concerning for endocarditis  leading to septic CNS emboli, will consult cardiology if family is agreeable  #Acute respiratory failure with hypoxemia Patient is on high flow oxygen Family refused intubation changed her CODE STATUS to DO NOT RESUSCITATE as patient's clinical situation is deteriorating drastically with very poor prognosis   # Acute/subacute right cerebellar infarct: MRI/MRA multiple small acute infarcts in the cerebral hemispheres bilaterally echocardiogram -pending  carotid Doppler less than 50% stenosis pending aspirin rectal suppositories N.p.o. until bedside swallow evaluation is done and speech therapy evaluation PT, OTconsultation requested Neurology is following  #  Acute encephalopathy in the setting of sepsis with possible cerebellar infarct and hypernatremia Continue to monitor mental status,neuro checks ABGs with hypoxia-secondary to poor inspiratory effort.  Provide oxygen via nasal cannula  #  Hypernatremia With hypokalemia this is due to poor by mouth intake over the past several days with dehydration Monitor sodium level Patient has received IV fluids normal saline  D5W with potassium supplements Serial sodium checks-sodium at 151, potassium 3.5 Nephrology following  5. Diabetes: Close monitoring blood sugars on D5 Diabetes nurse  management consultation requested Sliding scale ordered.  6. Acute kidney injury in the setting of dehydration and poor by mouth intake Stop nephrotoxic medications at this time Continue IV fluids and repeat BMP in a.m.  7. Essential hypertension: Due to acute  kidney injury will hold ACE inhibitor When necessary hydralazine ordered  8. Urinary incontinence: The patient is n.p.o.  9. Hyperlipidemia: Continue statin once patient is more awake and alert and tolerates p.o.   Condition guarded with extremely poor prognosis, family members are aware    All the records are reviewed and case discussed with Care Management/Social Workerr. Management plans discussed with the patient, family members including son, daughter-in-law and brother at bedside and they are in agreement.  CODE STATUS: fc   TOTAL TIME TAKING CARE OF THIS PATIENT: 39 minutes.   POSSIBLE D/C IN ?  DAYS, DEPENDING ON CLINICAL CONDITION.  Note: This dictation was prepared with Dragon dictation along with smaller phrase technology. Any transcriptional errors that result from this process are unintentional.   Ramonita Lab M.D on 04/23/2017 at 3:37 PM  Between 7am to 6pm - Pager - [phone] After 6pm go to [email or web address] - password [email or web address] [facility]  [facility]  Office  [phone]  CC: Primary care physician; [facility], [facility]

## 2017-04-23 NOTE — Progress Notes (Signed)
Consult received.  Case discussed with Dr. Amado CoeGouru.  Na is improving.  Recommend increasing D5W to 100cc/hr.  If sodium doesn't improve please call back.

## 2017-04-23 NOTE — Plan of Care (Signed)
Pt remains unresponsive during the shift. Tylenol and toradol given for fever with some improvement. Around 1800, A.fib, HR 100-130's on tele. Notified Dr Irven ShellingSalari of A.fib and last VS. MD to place orders.

## 2017-04-23 NOTE — Consult Note (Signed)
No improvement in examination   Past Medical History:  Diagnosis Date  . Diabetes mellitus without complication ([place])   . Hypertension     Past Surgical History:  Procedure Laterality Date  . ABDOMINAL HYSTERECTOMY    . CESAREAN SECTION      Family History  Problem Relation Age of Onset  . Stroke Mother   . Cancer Father    Social History:  reports that she has been smoking cigarettes.  She has been smoking about 1.00 pack per day. she has never used smokeless tobacco. She reports that she uses drugs. Drug: Marijuana. She reports that she does not drink alcohol.  Allergies:  Allergies  Allergen Reactions  . Abilify [Aripiprazole]     Tardive dyskinesia     Medications:  I have reviewed the patient's current medications. Prior to Admission:  Medications Prior to Admission  Medication Sig Dispense Refill Last Dose  . gabapentin (NEURONTIN) 600 MG tablet Take 600 mg by mouth at bedtime.    Past Week at Unknown time  . lisinopril (PRINIVIL,ZESTRIL) 40 MG tablet Take 1 tablet by mouth daily.   Past Week at unknown  . meloxicam (MOBIC) 15 MG tablet Take 1 tablet by mouth daily.   Past Week at Unknown time  . metFORMIN (GLUCOPHAGE) 1000 MG tablet Take 1 tablet by mouth 2 (two) times daily.   Past Week at Unknown time  . oxybutynin (DITROPAN-XL) 10 MG 24 hr tablet Take 10 mg by mouth 3 (three) times daily.    Past Week at Unknown time  . pramipexole (MIRAPEX) 1.5 MG tablet Take 1 tablet by mouth daily.   Past Week at Unknown time  . simvastatin (ZOCOR) 10 MG tablet Take 1 tablet by mouth daily.   Past Week at Unknown time  . venlafaxine XR (EFFEXOR-XR) 150 MG 24 hr capsule Take 2 capsules by mouth daily.   Past Week at Unknown time   Scheduled: . aspirin  300 mg Rectal Daily   Or  . aspirin EC  325 mg Oral Daily  . heparin  5,000 Units Subcutaneous Q8H  . insulin aspart  0-15 Units Subcutaneous TID WC  . insulin aspart  0-5 Units Subcutaneous QHS  . mouth rinse  15 mL Mouth  Rinse BID  . oxybutynin  25 mg Oral Daily  . pramipexole  1.5 mg Oral Daily  . simvastatin  10 mg Oral Daily  . venlafaxine XR  300 mg Oral Daily    ROS: Unable to determine due to mental status  Physical Examination: Blood pressure (!) 142/68, pulse 85, temperature 99.9 F (37.7 C), temperature source Axillary, resp. rate (!) 24, height _0  (1.575 m), weight 233 lb (105.7 kg), SpO2 93 %.  HEENT-  Hard swollen area at right jaw.  Normal external eye and conjunctiva.  Normal TM's bilaterally.  Normal auditory canals and external ears. Normal external nose, mucus membranes and septum.  Normal pharynx. Cardiovascular- S1, S2 normal, pulses palpable throughout   Lungs- chest clear, no wheezing, rales, normal symmetric air entry.  Using accessory muscles to breathe Abdomen- soft, non-tender; bowel sounds normal; no masses,  no organomegaly Extremities- no edema Lymph-no adenopathy palpable Musculoskeletal-no joint tenderness, deformity or swelling Skin-warm and dry, no hyperpigmentation, vitiligo, or suspicious lesions  Neurological Examination   Mental Status: Patient does not respond to verbal stimuli.  Does not respond to deep sternal rub.  Does not follow commands.  No verbalizations noted.  Cranial Nerves: II: patient does not respond confrontation  bilaterally, pupils right 4 mm, left 4 mm,and reactive bilaterally III,IV,VI: doll's response present bilaterally.  V,VII: corneal reflex present bilaterally  VIII: patient does not respond to verbal stimuli IX,X: gag reflex reduced, XI: trapezius strength unable to test bilaterally XII: tongue strength unable to test Motor: Extremities flaccid throughout.  No spontaneous movement noted.  No purposeful movements noted. Sensory: Does not respond to noxious stimuli in the right upper extremity.  Withdrawal noted to painful stimuli in all other extremities. Deep Tendon Reflexes:  2+ in the upper extremities, absent in the lower  extremities. Plantars: upgoing bilaterally Cerebellar: Unable to perform   Laboratory Studies:  Basic Metabolic Panel: Recent Labs  Lab 04/20/2017 1047 04/22/2017 1838 04/22/17 1125 04/22/17 1647 04/22/17 2102 04/22/17 2342 04/23/17 0748  NA 155*  --  153* 155*  --  154* 151*  K 3.2*  --  2.8*  --  3.2*  --  3.5  CL 113*  --  115*  --   --   --  113*  CO2 25  --  28  --   --   --  25  GLUCOSE 253*  --  200*  --   --   --  228*  BUN 55*  --  44*  --   --   --  34*  CREATININE 1.52*  --  0.89  --   --   --  0.93  CALCIUM 10.1  --  9.0  --   --   --  8.9  MG  --  2.2  --  2.1  --   --   --   PHOS  --   --   --  2.3*  --   --   --     Liver Function Tests: Recent Labs  Lab 04/20/2017 1047  AST 57*  ALT 46  ALKPHOS 88  BILITOT 1.3*  PROT 7.5  ALBUMIN 3.5   Recent Labs  Lab 04/25/2017 1047  LIPASE 17   No results for input(s): AMMONIA in the last 168 hours.  CBC: Recent Labs  Lab 04/12/2017 1047 04/22/17 1125 04/23/17 0748  WBC 13.9* 7.5 9.2  NEUTROABS 11.9*  --   --   HGB 15.3 13.5 13.0  HCT 47.9* 42.3 41.1  MCV 83.6 83.5 83.9  PLT 347 295 284    Cardiac Enzymes: Recent Labs  Lab 04/03/2017 1047 04/06/2017 1838 04/19/2017 2040 04/22/17 0243  CKTOTAL 239*  --   --   --   TROPONINI 0.08* 0.07* 0.06* 0.05*    BNP: Invalid input(s): POCBNP  CBG: Recent Labs  Lab 04/22/17 1704 04/22/17 2102 04/23/17 0734 04/23/17 0921 04/23/17 1147  GLUCAP 154* 178* 199* 215* 167*    Microbiology: Results for orders placed or performed during the hospital encounter of 04/27/2017  Blood Culture (routine x 2)     Status: Abnormal (Preliminary result)   Collection Time: 04/27/2017 10:48 AM  Result Value Ref Range Status   Specimen Description   Final    BLOOD RIGHT [facility] Performed at [facility], [address] [place], [place] [address]    Special Requests   Final    BOTTLES DRAWN AEROBIC AND ANAEROBIC Blood Culture adequate volume Performed at [facility], [address], [place], [place] [address]    Culture  Setup Time   Final    GRAM POSITIVE COCCI IN BOTH AEROBIC AND ANAEROBIC BOTTLES CRITICAL RESULT CALLED TO, READ BACK BY AND VERIFIED WITH: MATT MCBANE  AT 0322 ON 04/22/17 [place]. Performed at [facility], [address], [place], [place] [address]    Culture (A)  Final    STAPHYLOCOCCUS AUREUS SUSCEPTIBILITIES TO FOLLOW Performed at [facility], [place] [address] [place], [place] [address]    Report Status PENDING  Incomplete  Blood Culture (routine x 2)     Status: Abnormal (Preliminary result)   Collection Time: 04/02/2017 10:48 AM  Result Value Ref Range Status   Specimen Description   Final    BLOOD Blood Culture adequate volume Performed at [facility], [address], [place], [place] [address]    Special Requests   Final    BLOOD RIGHT HAND Performed at [facility], [address], [place], [place] [address]    Culture  Setup Time   Final    GRAM POSITIVE COCCI ANAEROBIC BOTTLE ONLY CRITICAL VALUE NOTED.  VALUE IS CONSISTENT WITH PREVIOUSLY REPORTED AND CALLED VALUE. Performed at [facility], [address] [place], [place] [address]    Culture STAPHYLOCOCCUS AUREUS (A)  Final   Report Status PENDING  Incomplete  Blood Culture ID Panel (Reflexed)     Status: Abnormal   Collection Time: 04/28/2017 10:48 AM  Result Value Ref Range Status   Enterococcus species NOT DETECTED NOT DETECTED Final   Vancomycin resistance NOT DETECTED NOT DETECTED Final   Listeria monocytogenes NOT DETECTED NOT DETECTED Final   Staphylococcus species DETECTED (A) NOT DETECTED Final    Comment: CRITICAL RESULT CALLED TO, READ BACK BY AND VERIFIED WITH: MATT MCBANE AT 0322 ON 04/22/17 [place].    Staphylococcus aureus DETECTED (A) NOT DETECTED Final    Comment: Methicillin (oxacillin) susceptible Staphylococcus aureus (MSSA). Preferred therapy is anti staphylococcal beta lactam  antibiotic (Cefazolin or Nafcillin), unless clinically contraindicated. CRITICAL RESULT CALLED TO, READ BACK BY AND VERIFIED WITH: MATT MCBANE AT 0322 ON 04/22/17 [place].    Methicillin resistance NOT DETECTED NOT DETECTED Final   Streptococcus species NOT DETECTED NOT DETECTED Final   Streptococcus agalactiae NOT DETECTED NOT DETECTED Final   Streptococcus pneumoniae NOT DETECTED NOT DETECTED Final   Streptococcus pyogenes NOT DETECTED NOT DETECTED Final   Acinetobacter baumannii NOT DETECTED NOT DETECTED Final   Enterobacteriaceae species NOT DETECTED NOT DETECTED Final   Enterobacter cloacae complex NOT DETECTED NOT DETECTED Final   Escherichia coli NOT DETECTED NOT DETECTED Final   Klebsiella oxytoca NOT DETECTED NOT DETECTED Final   Klebsiella pneumoniae NOT DETECTED NOT DETECTED Final   Proteus species NOT DETECTED NOT DETECTED Final   Serratia marcescens NOT DETECTED NOT DETECTED Final   Carbapenem resistance NOT DETECTED NOT DETECTED Final   Haemophilus influenzae NOT DETECTED NOT DETECTED Final   Neisseria meningitidis NOT DETECTED NOT DETECTED Final   Pseudomonas aeruginosa NOT DETECTED NOT DETECTED Final   Candida albicans NOT DETECTED NOT DETECTED Final   Candida glabrata NOT DETECTED NOT DETECTED Final   Candida krusei NOT DETECTED NOT DETECTED Final   Candida parapsilosis NOT DETECTED NOT DETECTED Final   Candida tropicalis NOT DETECTED NOT DETECTED Final    Comment: Performed at [facility], [address] [place], [place] [address]  Urine culture     Status: Abnormal   Collection Time: 04/19/2017 10:49 AM  Result Value Ref Range Status   Specimen Description   Final    URINE, RANDOM Performed at [facility], [address] [place],  [address]    Special Requests   Final  Normal Performed at [facility], [address] [place], [place] [address]    Culture >=100,000 COLONIES/mL ESCHERICHIA COLI (A)  Final   Report Status  04/23/2017 FINAL  Final   Organism ID, Bacteria ESCHERICHIA COLI (A)  Final      Susceptibility   Escherichia coli - MIC*    AMPICILLIN >=32 RESISTANT Resistant     CEFAZOLIN <=4 SENSITIVE Sensitive     CEFTRIAXONE <=1 SENSITIVE Sensitive     CIPROFLOXACIN >=4 RESISTANT Resistant     GENTAMICIN <=1 SENSITIVE Sensitive     IMIPENEM <=0.25 SENSITIVE Sensitive     NITROFURANTOIN <=16 SENSITIVE Sensitive     TRIMETH/SULFA <=20 SENSITIVE Sensitive     AMPICILLIN/SULBACTAM 8 SENSITIVE Sensitive     PIP/TAZO <=4 SENSITIVE Sensitive     Extended ESBL NEGATIVE Sensitive     * >=100,000 COLONIES/mL ESCHERICHIA COLI  MRSA PCR Screening     Status: None   Collection Time: 04/15/2017  5:30 PM  Result Value Ref Range Status   MRSA by PCR NEGATIVE NEGATIVE Final    Comment:        The GeneXpert MRSA Assay (FDA approved for NASAL specimens only), is one component of a comprehensive MRSA colonization surveillance program. It is not intended to diagnose MRSA infection nor to guide or monitor treatment for MRSA infections. Performed at [facility], [address] [place], [place] [address]   Culture, blood (single) w Reflex to ID Panel     Status: None (Preliminary result)   Collection Time: 04/22/17  4:47 PM  Result Value Ref Range Status   Specimen Description BLOOD RIGHT ANTECUBITAL  Final   Special Requests   Final    BOTTLES DRAWN AEROBIC AND ANAEROBIC Blood Culture adequate volume   Culture   Final    NO GROWTH < 24 HOURS Performed at [facility], [address] [place], [place] [address]    Report Status PENDING  Incomplete    Coagulation Studies: No results for input(s): LABPROT, INR in the last 72 hours.  Urinalysis:  Recent Labs  Lab 04/09/2017 1048  COLORURINE AMBER*  LABSPEC 1.019  PHURINE 5.0  GLUCOSEU NEGATIVE  HGBUR MODERATE*  BILIRUBINUR NEGATIVE  KETONESUR 5*  PROTEINUR 100*  NITRITE NEGATIVE  LEUKOCYTESUR SMALL*    Lipid Panel:     Component Value Date/Time   CHOL 111 04/22/2017 0243   TRIG 129 04/22/2017 0243   HDL 26 (L) 04/22/2017 0243   CHOLHDL 4.3 04/22/2017 0243   VLDL 26 04/22/2017 0243   LDLCALC 59 04/22/2017 0243    HgbA1C:  Lab Results  Component Value Date   HGBA1C 5.9 (H) 04/22/2017    Urine Drug Screen:      Component Value Date/Time   LABOPIA NONE DETECTED 03/06/2017 1721   COCAINSCRNUR NONE DETECTED 03/06/2017 1721   LABBENZ NONE DETECTED 03/06/2017 1721   AMPHETMU NONE DETECTED 03/06/2017 1721   THCU NONE DETECTED 03/06/2017 1721   LABBARB NONE DETECTED 03/06/2017 1721    Alcohol Level: No results for input(s): ETH in the last 168 hours.   Imaging: Ct Head Wo Contrast  Result Date: 04/22/2017 CLINICAL DATA:  Stroke follow-up.  Multiple falls. EXAM: CT HEAD WITHOUT CONTRAST TECHNIQUE: Contiguous axial images were obtained from the base of the skull through the vertex without intravenous contrast. COMPARISON:  Brain MRI 04/22/2017 Head CT 04/22/2017 FINDINGS: Brain: Multifocal hypoattenuation corresponding to areas of ischemia in the left frontal lobe, right parietal lobe and left occipital lobe.  No acute hemorrhage. No mass effect. Brain parenchyma and CSF-containing spaces are normal for age. Vascular: No hyperdense vessel or unexpected calcification. Skull: Normal visualized skull base, calvarium and extracranial soft tissues. Sinuses/Orbits: No sinus fluid levels or advanced mucosal thickening. No mastoid effusion. Normal orbits. IMPRESSION: 1. Expected evolution of density changes at the multiple sites of ischemia demonstrated on recent MRI. 2. No hemorrhage or mass effect. Electronically Signed   By: Ulyses Jarred M.D.   On: 04/22/2017 18:42   Mr Brain Wo Contrast  Result Date: 04/22/2017 CLINICAL DATA:  Initial evaluation for acute altered mental status, recent falls. Question possible stroke on previous CT. EXAM: MRI HEAD WITHOUT CONTRAST MRA HEAD WITHOUT CONTRAST TECHNIQUE:  Multiplanar, multiecho pulse sequences of the brain and surrounding structures were obtained without intravenous contrast. Angiographic images of the head were obtained using MRA technique without contrast. COMPARISON:  Prior CT from 03/29/2017. FINDINGS: MRI HEAD FINDINGS Brain: Study mildly degraded by motion artifact. Mild diffuse age-related cerebral atrophy. Patchy T2/FLAIR hyperintensity within the periventricular white matter most consistent with chronic microvascular ischemic disease, mild for age. There is abnormal restricted diffusion involving the cortical gray matter and underlying subcortical and deep white matter of the bilateral frontal and parietal lobes, left greater than right (series 100, image 39) this extends in a somewhat linear fashion, suggesting watershed type infarcts. Additional patchy areas of infarction present within the bilateral occipital lobes (series 100, image 30, 24). No associated hemorrhage or mass effect. No other areas of acute or subacute infarction. No acute or chronic intracranial hemorrhage. No mass lesion, midline shift, or mass effect. No hydrocephalus. No extra-axial fluid collection. Major dural sinuses are grossly patent. Pituitary gland suprasellar region normal. Midline structures intact and normal. Vascular: Major intracranial vascular flow voids are maintained. Skull and upper cervical spine: Craniocervical junction within normal limits. Bone marrow signal intensity normal. No scalp soft tissue abnormality. Sinuses/Orbits: Globes and orbital soft tissues within normal limits. Mild scattered mucosal thickening within the ethmoidal air cells. Paranasal sinuses are otherwise clear. Small left mastoid effusion noted, of doubtful significance. Inner ear structures normal. Other: None. MRA HEAD FINDINGS ANTERIOR CIRCULATION: Study moderately degraded by motion artifact, limiting evaluation of the mid and distal vasculature. Distal cervical segments of the internal  carotid arteries patent at the skull base. Petrous, cavernous, and supraclinoid segments patent without flow-limiting stenosis. ICA termini widely patent. A1 segments patent bilaterally. Grossly normal anterior communicating artery. Anterior cerebral arteries grossly patent to their distal aspects, although limited evaluation due to motion. Moderate atheromatous irregularity involving the ACA is suspected. M1 segments patent without obvious stenosis. Normal MCA bifurcations. No appreciable proximal M2 occlusion. Distal MCA branches not well evaluated on this motion degraded exam, but are grossly patent. POSTERIOR CIRCULATION: Vertebral arteries patent to the vertebrobasilar junction. Basilar artery patent to its distal aspect without flow-limiting stenosis. Superior cerebral arteries patent proximally. Left PCA supplied primarily via the basilar. Hypoplastic right P1 segment with prominent right posterior communicating artery. PCAs demonstrate moderate atheromatous irregularity with moderate to severe multifocal bilateral P2 stenoses. No appreciable aneurysm. IMPRESSION: MRI HEAD IMPRESSION: 1. 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 2. Mild age-related cerebral atrophy with chronic small vessel ischemic disease. MRA HEAD IMPRESSION: 1. Motion degraded exam. 2. No large or proximal arterial branch occlusion. No proximal or high-grade correctable stenosis identified. 3. Mild to moderate intracranial atherosclerotic change, most notable within the PCAs bilaterally.  Electronically Signed   By: Jeannine Boga M.D.   On: 04/22/2017 01:50   US Carotid Bilateral (at [facility] And [facility] Only)  Result Date: 04/22/2017 CLINICAL DATA:  CVA EXAM: BILATERAL CAROTID DUPLEX ULTRASOUND TECHNIQUE: Pearline Cables scale imaging, color Doppler and duplex ultrasound were performed of bilateral carotid and  vertebral arteries in the neck. COMPARISON:  None. FINDINGS: Criteria: Quantification of carotid stenosis is based on velocity parameters that correlate the residual internal carotid diameter with NASCET-based stenosis levels, using the diameter of the distal internal carotid lumen as the denominator for stenosis measurement. The following velocity measurements were obtained: RIGHT ICA:  95 cm/sec CCA:  250 cm/sec SYSTOLIC ICA/CCA RATIO:  0.7 DIASTOLIC ICA/CCA RATIO:  1.9 ECA:  249 cm/sec LEFT ICA:  107 cm/sec CCA:  539 cm/sec SYSTOLIC ICA/CCA RATIO:  0.8 DIASTOLIC ICA/CCA RATIO:  2.2 ECA:  173 cm/sec RIGHT CAROTID ARTERY: Mild calcified plaque in the bulb. Low resistance internal carotid Doppler pattern. RIGHT VERTEBRAL ARTERY:  Antegrade. LEFT CAROTID ARTERY: Moderate irregular calcified plaque in the bulb. Low resistance internal carotid Doppler pattern is preserved. Calcified plaque does extend into the internal carotid artery. LEFT VERTEBRAL ARTERY:  Antegrade. IMPRESSION: Less than 50% stenosis in the right and left internal carotid arteries. Electronically Signed   By: Marybelle Killings M.D.   On: 04/22/2017 08:59   Dg Chest Port 1 View  Result Date: 04/23/2017 CLINICAL DATA:  Hypoxic respiratory failure and sepsis. EXAM: PORTABLE CHEST 1 VIEW COMPARISON:  04/20/2017 FINDINGS: Stable top-normal heart size. Lung volumes are very low bilaterally. No focal airspace consolidation or pleural fluid identified. No evidence of pneumothorax. Possible component of large hiatal hernia. IMPRESSION: Low bilateral lung volumes.  Possible large hiatal hernia. Electronically Signed   By: Aletta Edouard M.D.   On: 04/23/2017 11:14   Mr Jodene Nam Head/brain JQ Cm  Result Date: 04/22/2017 CLINICAL DATA:  Initial evaluation for acute altered mental status, recent falls. Question possible stroke on previous CT. EXAM: MRI HEAD WITHOUT CONTRAST MRA HEAD WITHOUT CONTRAST TECHNIQUE: Multiplanar, multiecho pulse sequences of the brain  and surrounding structures were obtained without intravenous contrast. Angiographic images of the head were obtained using MRA technique without contrast. COMPARISON:  Prior CT from 04/02/2017. FINDINGS: MRI HEAD FINDINGS Brain: Study mildly degraded by motion artifact. Mild diffuse age-related cerebral atrophy. Patchy T2/FLAIR hyperintensity within the periventricular white matter most consistent with chronic microvascular ischemic disease, mild for age. There is abnormal restricted diffusion involving the cortical gray matter and underlying subcortical and deep white matter of the bilateral frontal and parietal lobes, left greater than right (series 100, image 39) this extends in a somewhat linear fashion, suggesting watershed type infarcts. Additional patchy areas of infarction present within the bilateral occipital lobes (series 100, image 30, 24). No associated hemorrhage or mass effect. No other areas of acute or subacute infarction. No acute or chronic intracranial hemorrhage. No mass lesion, midline shift, or mass effect. No hydrocephalus. No extra-axial fluid collection. Major dural sinuses are grossly patent. Pituitary gland suprasellar region normal. Midline structures intact and normal. Vascular: Major intracranial vascular flow voids are maintained. Skull and upper cervical spine: Craniocervical junction within normal limits. Bone marrow signal intensity normal. No scalp soft tissue abnormality. Sinuses/Orbits: Globes and orbital soft tissues within normal limits. Mild scattered mucosal thickening within the ethmoidal air cells. Paranasal sinuses are otherwise clear. Small left mastoid effusion noted, of doubtful significance. Inner ear structures normal. Other: None. MRA HEAD FINDINGS ANTERIOR CIRCULATION: Study moderately degraded by motion  artifact, limiting evaluation of the mid and distal vasculature. Distal cervical segments of the internal carotid arteries patent at the skull base. Petrous,  cavernous, and supraclinoid segments patent without flow-limiting stenosis. ICA termini widely patent. A1 segments patent bilaterally. Grossly normal anterior communicating artery. Anterior cerebral arteries grossly patent to their distal aspects, although limited evaluation due to motion. Moderate atheromatous irregularity involving the ACA is suspected. M1 segments patent without obvious stenosis. Normal MCA bifurcations. No appreciable proximal M2 occlusion. Distal MCA branches not well evaluated on this motion degraded exam, but are grossly patent. POSTERIOR CIRCULATION: Vertebral arteries patent to the vertebrobasilar junction. Basilar artery patent to its distal aspect without flow-limiting stenosis. Superior cerebral arteries patent proximally. Left PCA supplied primarily via the basilar. Hypoplastic right P1 segment with prominent right posterior communicating artery. PCAs demonstrate moderate atheromatous irregularity with moderate to severe multifocal bilateral P2 stenoses. No appreciable aneurysm. IMPRESSION: MRI HEAD IMPRESSION: 1. 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 2. Mild age-related cerebral atrophy with chronic small vessel ischemic disease. MRA HEAD IMPRESSION: 1. Motion degraded exam. 2. No large or proximal arterial branch occlusion. No proximal or high-grade correctable stenosis identified. 3. Mild to moderate intracranial atherosclerotic change, most notable within the PCAs bilaterally. Electronically Signed   By: Jeannine Boga M.D.   On: 04/22/2017 01:50    Assessment: 67 y.o. female presenting after having had multiple falls with altered mental status.  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  Patient on no antiplatelet therapy  prior to admission.  Carotid dopplers show no evidence of hemodynamically significant stenosis.     - Poor neurological examination and patient is not following commands.  - ABG with resp Alkalosis in setting of deceased resp drive - likely endocorditis - family does not appear to want aggressive care - Met with family imaging explained and they currently understand the situation - pt is DNR/DNI - likely comfort care in next few days.  Leotis Pain

## 2017-04-23 NOTE — Progress Notes (Signed)
SLP Cancellation Note  Patient Details Name: Sierra Bass MRN: [id number] DOB: 11/13/1950   Cancelled treatment:       Reason Eval/Treat Not Completed: Medical issues which prohibited therapy;Fatigue/lethargy limiting ability to participate Reviewed chart and spoke with nsg for re-attempt at evaluation. Pt remains unresponsive and is not appropriate for evaluation attempt. Will f/u in 1-2 days and evaluate when pt is medically appropriate.   Lauree ChandlerMaaran Tigard, [place], McKessonCCC-SLP  Speech-Language Pathologist   [place],Maaran 04/23/2017, 9:52 AM

## 2017-04-23 NOTE — Progress Notes (Signed)
04/23/2017 2:16 PM  Sierra Bass, Sierra Bass [id number]  Visit 2    Temp:  [99.2 F (37.3 C)-101.3 F (38.5 C)] 99.9 F (37.7 C) (01/26 1345) Pulse Rate:  [80-94] 85 (01/26 0851) Resp:  [22-26] 24 (01/26 1345) BP: (116-149)/(52-75) 142/68 (01/26 1345) SpO2:  [93 %-98 %] 93 % (01/26 1345),     Intake/Output Summary (Last 24 hours) at 04/23/2017 1416 Last data filed at 04/23/2017 0450 Gross per 24 hour  Intake 1178.75 ml  Output 900 ml  Net 278.75 ml    Results for orders placed or performed during the hospital encounter of 04/17/2017 (from the past 24 hour(s))  Magnesium     Status: None   Collection Time: 04/22/17  4:47 PM  Result Value Ref Range   Magnesium 2.1 1.7 - 2.4 mg/dL  Phosphorus     Status: Abnormal   Collection Time: 04/22/17  4:47 PM  Result Value Ref Range   Phosphorus 2.3 (L) 2.5 - 4.6 mg/dL  Sodium     Status: Abnormal   Collection Time: 04/22/17  4:47 PM  Result Value Ref Range   Sodium 155 (H) 135 - 145 mmol/L  Culture, blood (single) w Reflex to ID Panel     Status: None (Preliminary result)   Collection Time: 04/22/17  4:47 PM  Result Value Ref Range   Specimen Description BLOOD RIGHT ANTECUBITAL    Special Requests      BOTTLES DRAWN AEROBIC AND ANAEROBIC Blood Culture adequate volume   Culture      NO GROWTH < 24 HOURS Performed at [facility], [address] [place], [place] [address]    Report Status PENDING   Glucose, capillary     Status: Abnormal   Collection Time: 04/22/17  5:04 PM  Result Value Ref Range   Glucose-Capillary 154 (H) 65 - 99 mg/dL  Potassium     Status: Abnormal   Collection Time: 04/22/17  9:02 PM  Result Value Ref Range   Potassium 3.2 (L) 3.5 - 5.1 mmol/L  Glucose, capillary     Status: Abnormal   Collection Time: 04/22/17  9:02 PM  Result Value Ref Range   Glucose-Capillary 178 (H) 65 - 99 mg/dL   Comment 1 Notify RN    Comment 2 Document in Chart   Sodium     Status: Abnormal   Collection Time: 04/22/17  11:42 PM  Result Value Ref Range   Sodium 154 (H) 135 - 145 mmol/L  Glucose, capillary     Status: Abnormal   Collection Time: 04/23/17  7:34 AM  Result Value Ref Range   Glucose-Capillary 199 (H) 65 - 99 mg/dL  Basic metabolic panel     Status: Abnormal   Collection Time: 04/23/17  7:48 AM  Result Value Ref Range   Sodium 151 (H) 135 - 145 mmol/L   Potassium 3.5 3.5 - 5.1 mmol/L   Chloride 113 (H) 101 - 111 mmol/L   CO2 25 22 - 32 mmol/L   Glucose, Bld 228 (H) 65 - 99 mg/dL   BUN 34 (H) 6 - 20 mg/dL   Creatinine, Ser 1.910.93 0.44 - 1.00 mg/dL   Calcium 8.9 8.9 - 47.810.3 mg/dL   GFR calc non Af Amer >60 >60 mL/min   GFR calc Af Amer >60 >60 mL/min   Anion gap 13 5 - 15  CBC     Status: Abnormal   Collection Time: 04/23/17  7:48 AM  Result Value Ref Range   WBC 9.2 3.6 -  11.0 K/uL   RBC 4.90 3.80 - 5.20 MIL/uL   Hemoglobin 13.0 12.0 - 16.0 g/dL   HCT 09.8 11.9 - 14.7 %   MCV 83.9 80.0 - 100.0 fL   MCH 26.6 26.0 - 34.0 pg   MCHC 31.8 (L) 32.0 - 36.0 g/dL   RDW 82.9 (H) 56.2 - 13.0 %   Platelets 284 150 - 440 K/uL  Glucose, capillary     Status: Abnormal   Collection Time: 04/23/17  9:21 AM  Result Value Ref Range   Glucose-Capillary 215 (H) 65 - 99 mg/dL  Blood gas, arterial     Status: Abnormal   Collection Time: 04/23/17  9:50 AM  Result Value Ref Range   FIO2 0.28    Delivery systems NASAL CANNULA    pH, Arterial 7.52 (H) 7.350 - 7.450   pCO2 arterial 35 32.0 - 48.0 mmHg   pO2, Arterial 56 (L) 83.0 - 108.0 mmHg   Bicarbonate 28.6 (H) 20.0 - 28.0 mmol/L   Acid-Base Excess 5.7 (H) 0.0 - 2.0 mmol/L   O2 Saturation 91.9 %   Patient temperature 37.0    Collection site LEFT RADIAL    Sample type ARTERIAL DRAW    Allens test (pass/fail) PASS PASS  Glucose, capillary     Status: Abnormal   Collection Time: 04/23/17 11:47 AM  Result Value Ref Range   Glucose-Capillary 167 (H) 65 - 99 mg/dL    SUBJECTIVE:  Clinically still unresponsive  OBJECTIVE:  Right neck-erythema has  significantly decreased still fullness over the parotid  IMPRESSION: Parotitis   PLAN:   Culture from yesterday did not make it from the lab, therefore another culture was taken. This most likely this is staph infection. Until this culture returns I would recommend continue the vancomycin as this likely will be methicillin resistant staph. I will sign off.  For further questions please feel free to call.  Davina Poke 04/23/2017, 2:16 PM

## 2017-04-23 NOTE — Progress Notes (Signed)
Notified Dr Amado CoeGouru that family is concerned about pt having abdominal breathing. Pt also has temp of 101.3 axillary. Tylenol due at 10:00. RN decreased room temp, and removed blanket. Rest of VSS. Orders received.

## 2017-04-23 NOTE — Progress Notes (Signed)
Dr. Anne HahnWillis notified of increasing respirations and the change in lung sounds. Orders received. Will continue to monitor.

## 2017-04-23 NOTE — Progress Notes (Signed)
PT Cancellation Note  Patient Details Name: Sierra AmosCelia Bass MRN: [id number] DOB: 08/19/1950   Cancelled Treatment:    Reason Eval/Treat Not Completed: Medical issues which prohibited therapy(Chart reviewed for re-attempt at evaluation.  Per primary RN, patient remains non-responsive and unable to participate.  Will follow one additional day and initiate as medically appropriate.)   Rosalio Catterton H. Manson PasseyBrown, PT, DPT, NCS 04/23/17, 9:06 AM [phone]

## 2017-04-23 NOTE — Progress Notes (Signed)
OT Cancellation Note  Patient Details Name: Sierra AmosCelia Bass MRN: [id number] DOB: 11/06/1950   Cancelled Treatment:    Reason Eval/Treat Not Completed: Medical issues which prohibited therapy- pt declining - will check on pt again on Monday one more time   Oletta CohnDuPreez, Khloei Spiker OTR/L,CLT 04/23/2017, 10:05 AM

## 2017-04-24 ENCOUNTER — Inpatient Hospital Stay: Payer: Medicare HMO

## 2017-04-24 LAB — CULTURE, BLOOD (ROUTINE X 2)
SPECIAL REQUESTS: ADEQUATE
SPECIMEN DESCRIPTION: ADEQUATE

## 2017-04-24 LAB — BASIC METABOLIC PANEL
Anion gap: 7 (ref 5–15)
BUN: 35 mg/dL — ABNORMAL HIGH (ref 6–20)
CALCIUM: 8.6 mg/dL — AB (ref 8.9–10.3)
CO2: 29 mmol/L (ref 22–32)
CREATININE: 0.99 mg/dL (ref 0.44–1.00)
Chloride: 112 mmol/L — ABNORMAL HIGH (ref 101–111)
GFR calc Af Amer: >60 mL/min (ref >60)
GFR calc non Af Amer: 59 mL/min — ABNORMAL LOW (ref >60)
GLUCOSE: 180 mg/dL — AB (ref 65–99)
Potassium: 3.3 mmol/L — ABNORMAL LOW (ref 3.5–5.1)
Sodium: 148 mmol/L — ABNORMAL HIGH (ref 135–145)

## 2017-04-24 LAB — PHOSPHORUS: PHOSPHORUS: 2.2 mg/dL — AB (ref 2.5–4.6)

## 2017-04-24 LAB — CBC
HCT: 38.9 % (ref 35.0–47.0)
Hemoglobin: 12.4 g/dL (ref 12.0–16.0)
MCH: 26.8 pg (ref 26.0–34.0)
MCHC: 31.8 g/dL — AB (ref 32.0–36.0)
MCV: 84.2 fL (ref 80.0–100.0)
PLATELETS: 209 10*3/uL (ref 150–440)
RBC: 4.62 MIL/uL (ref 3.80–5.20)
RDW: 16 % — ABNORMAL HIGH (ref 11.5–14.5)
WBC: 11 10*3/uL (ref 3.6–11.0)

## 2017-04-24 LAB — MAGNESIUM: Magnesium: 1.9 mg/dL (ref 1.7–2.4)

## 2017-04-24 LAB — TROPONIN I
Troponin I: 0.06 ng/mL (ref <0.03)
Troponin I: 0.06 ng/mL (ref <0.03)

## 2017-04-24 LAB — SODIUM: SODIUM: 148 mmol/L — AB (ref 135–145)

## 2017-04-24 LAB — GLUCOSE, CAPILLARY: GLUCOSE-CAPILLARY: 169 mg/dL — AB (ref 65–99)

## 2017-04-24 MED ORDER — POLYVINYL ALCOHOL 1.4 % OP SOLN
1.0000 [drp] | Freq: Four times a day (QID) | OPHTHALMIC | Status: DC | PRN
  Filled 2017-04-24: qty 15

## 2017-04-24 MED ORDER — POTASSIUM PHOSPHATES 15 MMOLE/5ML IV SOLN
15.0000 mmol | Freq: Once | INTRAVENOUS | Status: DC
  Filled 2017-04-24: qty 5

## 2017-04-24 MED ORDER — HALOPERIDOL 0.5 MG PO TABS
0.5000 mg | ORAL_TABLET | ORAL | Status: DC | PRN
  Filled 2017-04-24: qty 1

## 2017-04-24 MED ORDER — FUROSEMIDE 10 MG/ML IJ SOLN
20.0000 mg | Freq: Once | INTRAMUSCULAR | Status: AC
  Administered 2017-04-24: 01:00:00 20 mg via INTRAVENOUS
  Filled 2017-04-24: qty 2

## 2017-04-24 MED ORDER — FUROSEMIDE 10 MG/ML IJ SOLN
40.0000 mg | Freq: Once | INTRAMUSCULAR | Status: AC
  Administered 2017-04-24: 40 mg via INTRAVENOUS
  Filled 2017-04-24: qty 4

## 2017-04-24 MED ORDER — HALOPERIDOL LACTATE 2 MG/ML PO CONC
0.5000 mg | ORAL | Status: DC | PRN
  Filled 2017-04-24: qty 0.3

## 2017-04-24 MED ORDER — ACETAMINOPHEN 650 MG RE SUPP: 650.0000 mg | Freq: Four times a day (QID) | RECTAL | Status: DC | PRN

## 2017-04-24 MED ORDER — VANCOMYCIN HCL IN DEXTROSE 750-5 MG/150ML-% IV SOLN
750.0000 mg | Freq: Two times a day (BID) | INTRAVENOUS | Status: DC
  Filled 2017-04-24: qty 150

## 2017-04-24 MED ORDER — MORPHINE SULFATE (PF) 2 MG/ML IV SOLN
2.0000 mg | INTRAVENOUS | Status: DC | PRN
  Administered 2017-04-25 (×2): 2 mg via INTRAVENOUS
  Filled 2017-04-24 (×2): qty 1

## 2017-04-24 MED ORDER — MORPHINE SULFATE (PF) 2 MG/ML IV SOLN
1.0000 mg | Freq: Once | INTRAVENOUS | Status: AC
  Administered 2017-04-24: 11:00:00 1 mg via INTRAVENOUS
  Filled 2017-04-24: qty 1

## 2017-04-24 MED ORDER — GLYCOPYRROLATE 0.2 MG/ML IJ SOLN
0.2000 mg | INTRAMUSCULAR | Status: DC | PRN
  Administered 2017-04-24 – 2017-04-26 (×6): 0.2 mg via INTRAVENOUS
  Filled 2017-04-24 (×7): qty 1

## 2017-04-24 MED ORDER — SODIUM CHLORIDE 0.9 % IV SOLN
5.0000 mg/h | INTRAVENOUS | Status: DC
  Administered 2017-04-24: 15:00:00 5 mg/h via INTRAVENOUS
  Administered 2017-04-25: 15:00:00 12 mg/h via INTRAVENOUS
  Administered 2017-04-25: 22:00:00 5 mg/h via INTRAVENOUS
  Filled 2017-04-24 (×2): qty 10

## 2017-04-24 MED ORDER — FUROSEMIDE 10 MG/ML IJ SOLN: 20.0000 mg | Freq: Three times a day (TID) | INTRAMUSCULAR | Status: DC | PRN

## 2017-04-24 MED ORDER — BIOTENE DRY MOUTH MT LIQD: 15.0000 mL | OROMUCOSAL | Status: DC | PRN

## 2017-04-24 MED ORDER — GLYCOPYRROLATE 0.2 MG/ML IJ SOLN
0.2000 mg | INTRAMUSCULAR | Status: DC | PRN
  Filled 2017-04-24: qty 1

## 2017-04-24 MED ORDER — ONDANSETRON HCL 4 MG/2ML IJ SOLN: 4.0000 mg | Freq: Four times a day (QID) | INTRAMUSCULAR | Status: DC | PRN

## 2017-04-24 MED ORDER — ACETAMINOPHEN 325 MG PO TABS: 650.0000 mg | ORAL_TABLET | Freq: Four times a day (QID) | ORAL | Status: DC | PRN

## 2017-04-24 MED ORDER — GLYCOPYRROLATE 1 MG PO TABS
1.0000 mg | ORAL_TABLET | ORAL | Status: DC | PRN
  Filled 2017-04-24: qty 1

## 2017-04-24 MED ORDER — ONDANSETRON 4 MG PO TBDP
4.0000 mg | ORAL_TABLET | Freq: Four times a day (QID) | ORAL | Status: DC | PRN
  Filled 2017-04-24: qty 1

## 2017-04-24 MED ORDER — MORPHINE SULFATE (PF) 4 MG/ML IV SOLN
4.0000 mg | INTRAVENOUS | Status: DC | PRN
  Administered 2017-04-24: 4 mg via INTRAVENOUS
  Filled 2017-04-24: qty 1

## 2017-04-24 MED ORDER — HALOPERIDOL LACTATE 5 MG/ML IJ SOLN: 0.5000 mg | INTRAMUSCULAR | Status: DC | PRN

## 2017-04-24 NOTE — Consult Note (Signed)
More awake as opening eyes but still poor respiratory functions.    Past Medical History:  Diagnosis Date  . Diabetes mellitus without complication (HCC)   . Hypertension     Past Surgical History:  Procedure Laterality Date  . ABDOMINAL HYSTERECTOMY    . CESAREAN SECTION      Family History  Problem Relation Age of Onset  . Stroke Mother   . Cancer Father    Social History:  reports that she has been smoking cigarettes.  She has been smoking about 1.00 pack per day. she has never used smokeless tobacco. She reports that she uses drugs. Drug: Marijuana. She reports that she does not drink alcohol.  Allergies:  Allergies  Allergen Reactions  . Abilify [Aripiprazole]     Tardive dyskinesia     Medications:  I have reviewed the patient's current medications. Prior to Admission:  Medications Prior to Admission  Medication Sig Dispense Refill Last Dose  . gabapentin (NEURONTIN) 600 MG tablet Take 600 mg by mouth at bedtime.    Past Week at Unknown time  . lisinopril (PRINIVIL,ZESTRIL) 40 MG tablet Take 1 tablet by mouth daily.   Past Week at unknown  . meloxicam (MOBIC) 15 MG tablet Take 1 tablet by mouth daily.   Past Week at Unknown time  . metFORMIN (GLUCOPHAGE) 1000 MG tablet Take 1 tablet by mouth 2 (two) times daily.   Past Week at Unknown time  . oxybutynin (DITROPAN-XL) 10 MG 24 hr tablet Take 10 mg by mouth 3 (three) times daily.    Past Week at Unknown time  . pramipexole (MIRAPEX) 1.5 MG tablet Take 1 tablet by mouth daily.   Past Week at Unknown time  . simvastatin (ZOCOR) 10 MG tablet Take 1 tablet by mouth daily.   Past Week at Unknown time  . venlafaxine XR (EFFEXOR-XR) 150 MG 24 hr capsule Take 2 capsules by mouth daily.   Past Week at Unknown time   Scheduled: . aspirin  300 mg Rectal Daily   Or  . aspirin EC  325 mg Oral Daily  . heparin  5,000 Units Subcutaneous Q8H  . insulin aspart  0-15 Units Subcutaneous TID WC  . insulin aspart  0-5 Units  Subcutaneous QHS  . mouth rinse  15 mL Mouth Rinse BID  . metoprolol tartrate  5 mg Intravenous Q6H  . oxybutynin  25 mg Oral Daily  . pramipexole  1.5 mg Oral Daily  . simvastatin  10 mg Oral Daily  . venlafaxine XR  300 mg Oral Daily      Neurological Examination   Mental Status: Opens eyes but does not clearly follow commands.   Cranial Nerves: II: patient does not respond confrontation bilaterally, pupils right 4 mm, left 4 mm,and reactive bilaterally III,IV,VI: doll's response present bilaterally.  V,VII: corneal reflex present bilaterally  VIII: patient does not respond to verbal stimuli IX,X: gag reflex reduced, XI: trapezius strength unable to test bilaterally XII: tongue strength unable to test Motor: Extremities flaccid throughout.  No spontaneous movement noted.  No purposeful movements noted. Sensory: Does not respond to noxious stimuli in the right upper extremity.  Withdrawal noted to painful stimuli in all other extremities. Deep Tendon Reflexes:  2+ in the upper extremities, absent in the lower extremities. Plantars: upgoing bilaterally Cerebellar: Unable to perform   Laboratory Studies:  Basic Metabolic Panel: Recent Labs  Lab 04/14/2017 1047 04/25/2017 1838 04/22/17 1125 04/22/17 1647 04/22/17 2102 04/22/17 2342 04/23/17 0748 04/23/17 1538 04/23/17  2339 04/24/17 0715  NA 155*  --  153* 155*  --  154* 151* 153* 148* 148*  K 3.2*  --  2.8*  --  3.2*  --  3.5  --   --  3.3*  CL 113*  --  115*  --   --   --  113*  --   --  112*  CO2 25  --  28  --   --   --  25  --   --  29  GLUCOSE 253*  --  200*  --   --   --  228*  --   --  180*  BUN 55*  --  44*  --   --   --  34*  --   --  35*  CREATININE 1.52*  --  0.89  --   --   --  0.93  --   --  0.99  CALCIUM 10.1  --  9.0  --   --   --  8.9  --   --  8.6*  MG  --  2.2  --  2.1  --   --   --   --   --  1.9  PHOS  --   --   --  2.3*  --   --   --   --   --  2.2*    Liver Function Tests: Recent Labs  Lab  04/16/2017 1047  AST 57*  ALT 46  ALKPHOS 88  BILITOT 1.3*  PROT 7.5  ALBUMIN 3.5   Recent Labs  Lab 04/25/2017 1047  LIPASE 17   No results for input(s): AMMONIA in the last 168 hours.  CBC: Recent Labs  Lab 04/20/2017 1047 04/22/17 1125 04/23/17 0748 04/24/17 0715  WBC 13.9* 7.5 9.2 11.0  NEUTROABS 11.9*  --   --   --   HGB 15.3 13.5 13.0 12.4  HCT 47.9* 42.3 41.1 38.9  MCV 83.6 83.5 83.9 84.2  PLT 347 295 284 209    Cardiac Enzymes: Recent Labs  Lab 04/28/2017 1047  04/25/2017 2040 04/22/17 0243 04/23/17 1851 04/23/17 2339 04/24/17 0715  CKTOTAL 239*  --   --   --   --   --   --   TROPONINI 0.08*   < > 0.06* 0.05* 0.06* 0.06* 0.06*   < > = values in this interval not displayed.    BNP: Invalid input(s): POCBNP  CBG: Recent Labs  Lab 04/23/17 0921 04/23/17 1147 04/23/17 1632 04/23/17 2111 04/24/17 0738  GLUCAP 215* 167* 153* 179* 169*    Microbiology: Results for orders placed or performed during the hospital encounter of 04/25/2017  Blood Culture (routine x 2)     Status: Abnormal   Collection Time: 03/31/2017 10:48 AM  Result Value Ref Range Status   Specimen Description   Final    BLOOD RIGHT [facility] Performed at [facility], [address] [place], [place] [address]    Special Requests   Final    BOTTLES DRAWN AEROBIC AND ANAEROBIC Blood Culture adequate volume Performed at [facility], [address] [place], [place] [address]    Culture  Setup Time   Final    GRAM POSITIVE COCCI IN BOTH AEROBIC AND ANAEROBIC BOTTLES CRITICAL RESULT CALLED TO, READ BACK BY AND VERIFIED WITH: MATT MCBANE AT 0322 ON 04/22/17 [facility]. Performed at [facility], [address] [place], [place] [address]    Culture STAPHYLOCOCCUS AUREUS (  A)  Final   Report Status 04/24/2017 FINAL  Final   Organism ID, Bacteria STAPHYLOCOCCUS AUREUS  Final      Susceptibility   Staphylococcus aureus - MIC*    CIPROFLOXACIN <=0.5 SENSITIVE Sensitive      ERYTHROMYCIN >=8 RESISTANT Resistant     GENTAMICIN <=0.5 SENSITIVE Sensitive     OXACILLIN <=0.25 SENSITIVE Sensitive     TETRACYCLINE <=1 SENSITIVE Sensitive     VANCOMYCIN <=0.5 SENSITIVE Sensitive     TRIMETH/SULFA <=10 SENSITIVE Sensitive     CLINDAMYCIN RESISTANT Resistant     RIFAMPIN <=0.5 SENSITIVE Sensitive     Inducible Clindamycin POSITIVE Resistant     * STAPHYLOCOCCUS AUREUS  Blood Culture (routine x 2)     Status: Abnormal   Collection Time: May 21, 2017 10:48 AM  Result Value Ref Range Status   Specimen Description   Final    BLOOD Blood Culture adequate volume Performed at [facility], [address], [place], [place] [address]    Special Requests   Final    BLOOD RIGHT HAND Performed at [facility], [address] [place], [place] [address]    Culture  Setup Time   Final    GRAM POSITIVE COCCI ANAEROBIC BOTTLE ONLY CRITICAL VALUE NOTED.  VALUE IS CONSISTENT WITH PREVIOUSLY REPORTED AND CALLED VALUE. Performed at [facility], [address] [place], [place] [address]    Culture (A)  Final    STAPHYLOCOCCUS AUREUS SUSCEPTIBILITIES PERFORMED ON PREVIOUS CULTURE WITHIN THE LAST 5 DAYS. Performed at [facility], [address], [place], [place] [address]    Report Status 04/24/2017 FINAL  Final  Blood Culture ID Panel (Reflexed)     Status: Abnormal   Collection Time: 05/21/17 10:48 AM  Result Value Ref Range Status   Enterococcus species NOT DETECTED NOT DETECTED Final   Vancomycin resistance NOT DETECTED NOT DETECTED Final   Listeria monocytogenes NOT DETECTED NOT DETECTED Final   Staphylococcus species DETECTED (A) NOT DETECTED Final    Comment: CRITICAL RESULT CALLED TO, READ BACK BY AND VERIFIED WITH: MATT MCBANE AT 0322 ON 04/22/17 MMC.    Staphylococcus aureus DETECTED (A) NOT DETECTED Final    Comment: Methicillin (oxacillin) susceptible Staphylococcus aureus (MSSA). Preferred therapy is anti staphylococcal beta  lactam antibiotic (Cefazolin or Nafcillin), unless clinically contraindicated. CRITICAL RESULT CALLED TO, READ BACK BY AND VERIFIED WITH: MATT MCBANE AT 0322 ON 04/22/17 MMC.    Methicillin resistance NOT DETECTED NOT DETECTED Final   Streptococcus species NOT DETECTED NOT DETECTED Final   Streptococcus agalactiae NOT DETECTED NOT DETECTED Final   Streptococcus pneumoniae NOT DETECTED NOT DETECTED Final   Streptococcus pyogenes NOT DETECTED NOT DETECTED Final   Acinetobacter baumannii NOT DETECTED NOT DETECTED Final   Enterobacteriaceae species NOT DETECTED NOT DETECTED Final   Enterobacter cloacae complex NOT DETECTED NOT DETECTED Final   Escherichia coli NOT DETECTED NOT DETECTED Final   Klebsiella oxytoca NOT DETECTED NOT DETECTED Final   Klebsiella pneumoniae NOT DETECTED NOT DETECTED Final   Proteus species NOT DETECTED NOT DETECTED Final   Serratia marcescens NOT DETECTED NOT DETECTED Final   Carbapenem resistance NOT DETECTED NOT DETECTED Final   Haemophilus influenzae NOT DETECTED NOT DETECTED Final   Neisseria meningitidis NOT DETECTED NOT DETECTED Final   Pseudomonas aeruginosa NOT DETECTED NOT DETECTED Final   Candida albicans NOT DETECTED NOT DETECTED Final   Candida glabrata NOT DETECTED NOT DETECTED Final   Candida krusei NOT DETECTED NOT DETECTED Final  Candida parapsilosis NOT DETECTED NOT DETECTED Final   Candida tropicalis NOT DETECTED NOT DETECTED Final    Comment: Performed at [facility], [address] [place], [place] [address]  Urine culture     Status: Abnormal   Collection Time: 04/12/2017 10:49 AM  Result Value Ref Range Status   Specimen Description   Final    URINE, RANDOM Performed at [facility], [address] [place], [place] [address]    Special Requests   Final    Normal Performed at [facility], [address] [place], [place] [address]    Culture >=100,000 COLONIES/mL ESCHERICHIA COLI (A)  Final   Report  Status 04/23/2017 FINAL  Final   Organism ID, Bacteria ESCHERICHIA COLI (A)  Final      Susceptibility   Escherichia coli - MIC*    AMPICILLIN >=32 RESISTANT Resistant     CEFAZOLIN <=4 SENSITIVE Sensitive     CEFTRIAXONE <=1 SENSITIVE Sensitive     CIPROFLOXACIN >=4 RESISTANT Resistant     GENTAMICIN <=1 SENSITIVE Sensitive     IMIPENEM <=0.25 SENSITIVE Sensitive     NITROFURANTOIN <=16 SENSITIVE Sensitive     TRIMETH/SULFA <=20 SENSITIVE Sensitive     AMPICILLIN/SULBACTAM 8 SENSITIVE Sensitive     PIP/TAZO <=4 SENSITIVE Sensitive     Extended ESBL NEGATIVE Sensitive     * >=100,000 COLONIES/mL ESCHERICHIA COLI  MRSA PCR Screening     Status: None   Collection Time: 03/29/2017  5:30 PM  Result Value Ref Range Status   MRSA by PCR NEGATIVE NEGATIVE Final    Comment:        The GeneXpert MRSA Assay (FDA approved for NASAL specimens only), is one component of a comprehensive MRSA colonization surveillance program. It is not intended to diagnose MRSA infection nor to guide or monitor treatment for MRSA infections. Performed at [facility], [address] [place], [place] [address]   Culture, blood (single) w Reflex to ID Panel     Status: None (Preliminary result)   Collection Time: 04/22/17  4:47 PM  Result Value Ref Range Status   Specimen Description BLOOD RIGHT ANTECUBITAL  Final   Special Requests   Final    BOTTLES DRAWN AEROBIC AND ANAEROBIC Blood Culture adequate volume   Culture   Final    NO GROWTH 2 DAYS Performed at [facility], [address], [place], [place] [address]    Report Status PENDING  Incomplete  Aerobic/Anaerobic Culture (surgical/deep wound)     Status: None (Preliminary result)   Collection Time: 04/23/17  2:14 PM  Result Value Ref Range Status   Specimen Description   Final    GLAND RIGHT PAROTID Performed at [facility], [address] [place], [place] [address]    Special Requests   Final    NONE Performed  at [facility], [address] [place], [place] [address]    Gram Stain   Final    ABUNDANT WBC PRESENT, PREDOMINANTLY PMN MODERATE GRAM POSITIVE COCCI IN CLUSTERS Performed at [facility], [address] [place], [place] [address]    Culture PENDING  Incomplete   Report Status PENDING  Incomplete    Coagulation Studies: No results for input(s): LABPROT, INR in the last 72 hours.  Urinalysis:  Recent Labs  Lab 04/20/2017 1048  COLORURINE AMBER*  LABSPEC 1.019  PHURINE 5.0  GLUCOSEU NEGATIVE  HGBUR MODERATE*  BILIRUBINUR NEGATIVE  KETONESUR 5*  PROTEINUR 100*  NITRITE NEGATIVE  LEUKOCYTESUR SMALL*    Lipid Panel:    Component Value Date/Time   CHOL 111 04/22/2017 0243   TRIG 129 04/22/2017 0243   HDL 26 (L) 04/22/2017 0243   CHOLHDL 4.3 04/22/2017 0243   VLDL 26 04/22/2017 0243   LDLCALC 59 04/22/2017 0243    HgbA1C:  Lab Results  Component Value Date   HGBA1C 5.9 (H) 04/22/2017    Urine Drug Screen:      Component Value Date/Time   LABOPIA NONE DETECTED 03/06/2017 1721   COCAINSCRNUR NONE DETECTED 03/06/2017 1721   LABBENZ NONE DETECTED 03/06/2017 1721   AMPHETMU NONE DETECTED 03/06/2017 1721   THCU NONE DETECTED 03/06/2017 1721   LABBARB NONE DETECTED 03/06/2017 1721    Alcohol Level: No results for input(s): ETH in the last 168 hours.   Imaging: Ct Head Wo Contrast  Result Date: 04/22/2017 CLINICAL DATA:  Stroke follow-up.  Multiple falls. EXAM: CT HEAD WITHOUT CONTRAST TECHNIQUE: Contiguous axial images were obtained from the base of the skull through the vertex without intravenous contrast. COMPARISON:  Brain MRI 04/22/2017 Head CT 04/11/2017 FINDINGS: Brain: Multifocal hypoattenuation corresponding to areas of ischemia in the left frontal lobe, right parietal lobe and left occipital lobe. No acute hemorrhage. No mass effect. Brain parenchyma and CSF-containing spaces are normal for age. Vascular: No hyperdense vessel or unexpected  calcification. Skull: Normal visualized skull base, calvarium and extracranial soft tissues. Sinuses/Orbits: No sinus fluid levels or advanced mucosal thickening. No mastoid effusion. Normal orbits. IMPRESSION: 1. Expected evolution of density changes at the multiple sites of ischemia demonstrated on recent MRI. 2. No hemorrhage or mass effect. Electronically Signed   By: Deatra Robinson M.D.   On: 04/22/2017 18:42   Dg Chest Port 1 View  Result Date: 04/24/2017 CLINICAL DATA:  Dyspnea.  History of hypertension and diabetes. EXAM: PORTABLE CHEST 1 VIEW COMPARISON:  04/23/2017 FINDINGS: Shallow inspiration with linear atelectasis in the lung bases. Mild cardiac enlargement. No vascular congestion or edema. No blunting of costophrenic angles. No pneumothorax. Mediastinal contours appear intact. IMPRESSION: Shallow inspiration with linear atelectasis in the lung bases. Cardiac enlargement. No focal consolidation. Electronically Signed   By: Burman Nieves M.D.   On: 04/24/2017 00:58   Dg Chest Port 1 View  Result Date: 04/23/2017 CLINICAL DATA:  Hypoxic respiratory failure and sepsis. EXAM: PORTABLE CHEST 1 VIEW COMPARISON:  04/13/2017 FINDINGS: Stable top-normal heart size. Lung volumes are very low bilaterally. No focal airspace consolidation or pleural fluid identified. No evidence of pneumothorax. Possible component of large hiatal hernia. IMPRESSION: Low bilateral lung volumes.  Possible large hiatal hernia. Electronically Signed   By: Irish Lack M.D.   On: 04/23/2017 11:14    Assessment: 67 y.o. female presenting after having had multiple falls with altered mental status.  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  Patient on no antiplatelet therapy prior to admission.  Carotid dopplers show no evidence of hemodynamically significant stenosis.     - Poor neurological examination  and patient is not following commands but is able to open her eyes today .  - ABG with resp Alkalosis in setting of deceased resp drive. Was placed on high flow  - likely endocorditis - family does not appear to want aggressive care and understand the current situation  - shallow breathing with decreased volumes at bases.  - pt is DNR/DNI - possibly comfort  care in next few days.  Sierra Bass

## 2017-04-24 NOTE — Progress Notes (Signed)
   04/24/17 1600  Clinical Encounter Type  Visited With Family  Visit Type Initial  Referral From Nurse   While rounding on unit, staff suggested chaplain check with patient family based on patient's move to comfort care.  Family appreciative but declined visit.

## 2017-04-24 NOTE — Progress Notes (Signed)
ANTIBIOTIC CONSULT NOTE  Pharmacy Consult for vancomycin Indication: sepsis  Allergies  Allergen Reactions  . Abilify [Aripiprazole]     Tardive dyskinesia     Patient Measurements: Height: 5\' 2"  (157.5 cm) Weight: 233 lb (105.7 kg) IBW/kg (Calculated) : 50.1 Adjusted Body Weight:   Vital Signs: Temp: 99.2 F (37.3 C) (01/27 0913) Temp Source: Axillary (01/27 0913) BP: 109/42 (01/27 0903) Pulse Rate: 84 (01/27 0904) Intake/Output from previous day: 01/26 0701 - 01/27 0700 In: 1615 [I.V.:915; IV Piggyback:700] Out: 1250 [Urine:1250] Intake/Output from this shift: Total I/O In: 100 [IV Piggyback:100] Out: -   Labs: Recent Labs    04/22/17 1125 04/23/17 0748 04/24/17 0715  WBC 7.5 9.2 11.0  HGB 13.5 13.0 12.4  PLT 295 284 209  CREATININE 0.89 0.93 0.99   Estimated Creatinine Clearance: 64.7 mL/min (by C-G formula based on SCr of 0.99 mg/dL). No results for input(s): VANCOTROUGH, VANCOPEAK, VANCORANDOM, GENTTROUGH, GENTPEAK, GENTRANDOM, TOBRATROUGH, TOBRAPEAK, TOBRARND, AMIKACINPEAK, AMIKACINTROU, AMIKACIN in the last 72 hours.   Microbiology: Recent Results (from the past 720 hour(s))  Blood Culture (routine x 2)     Status: Abnormal   Collection Time: 04/06/2017 10:48 AM  Result Value Ref Range Status   Specimen Description   Final    BLOOD RIGHT [facility] Performed at [facility], [address], [place], [place] [address]    Special Requests   Final    BOTTLES DRAWN AEROBIC AND ANAEROBIC Blood Culture adequate volume Performed at [facility], [address] [place], [place] [address]    Culture  Setup Time   Final    GRAM POSITIVE COCCI IN BOTH AEROBIC AND ANAEROBIC BOTTLES CRITICAL RESULT CALLED TO, READ BACK BY AND VERIFIED WITH: MATT MCBANE AT 0322 ON 04/22/17 [facility]. Performed at [facility], [address] [place], [place] [address]    Culture STAPHYLOCOCCUS AUREUS (A)  Final   Report Status 04/24/2017 FINAL  Final    Organism ID, Bacteria STAPHYLOCOCCUS AUREUS  Final      Susceptibility   Staphylococcus aureus - MIC*    CIPROFLOXACIN <=0.5 SENSITIVE Sensitive     ERYTHROMYCIN >=8 RESISTANT Resistant     GENTAMICIN <=0.5 SENSITIVE Sensitive     OXACILLIN <=0.25 SENSITIVE Sensitive     TETRACYCLINE <=1 SENSITIVE Sensitive     VANCOMYCIN <=0.5 SENSITIVE Sensitive     TRIMETH/SULFA <=10 SENSITIVE Sensitive     CLINDAMYCIN RESISTANT Resistant     RIFAMPIN <=0.5 SENSITIVE Sensitive     Inducible Clindamycin POSITIVE Resistant     * STAPHYLOCOCCUS AUREUS  Blood Culture (routine x 2)     Status: Abnormal   Collection Time: 04/20/2017 10:48 AM  Result Value Ref Range Status   Specimen Description   Final    BLOOD Blood Culture adequate volume Performed at [facility], [address], [place], [place] [address]    Special Requests   Final    BLOOD RIGHT HAND Performed at [facility], [address], [place], [place] [address]    Culture  Setup Time   Final    GRAM POSITIVE COCCI ANAEROBIC BOTTLE ONLY CRITICAL VALUE NOTED.  VALUE IS CONSISTENT WITH PREVIOUSLY REPORTED AND CALLED VALUE. Performed at [facility], [address] [place], [place] [address]    Culture (A)  Final    STAPHYLOCOCCUS AUREUS SUSCEPTIBILITIES PERFORMED ON PREVIOUS CULTURE WITHIN THE LAST 5 DAYS. Performed at [facility], [address] [place], [place] [address]  Report Status 04/24/2017 FINAL  Final  Blood Culture ID Panel (Reflexed)     Status: Abnormal   Collection Time: 04/28/17 10:48 AM  Result Value Ref Range Status   Enterococcus species NOT DETECTED NOT DETECTED Final   Vancomycin resistance NOT DETECTED NOT DETECTED Final   Listeria monocytogenes NOT DETECTED NOT DETECTED Final   Staphylococcus species DETECTED (A) NOT DETECTED Final    Comment: CRITICAL RESULT CALLED TO, READ BACK BY AND VERIFIED WITH: MATT MCBANE AT 0322 ON 04/22/17 M[facility].    Staphylococcus aureus  DETECTED (A) NOT DETECTED Final    Comment: Methicillin (oxacillin) susceptible Staphylococcus aureus (MSSA). Preferred therapy is anti staphylococcal beta lactam antibiotic (Cefazolin or Nafcillin), unless clinically contraindicated. CRITICAL RESULT CALLED TO, READ BACK BY AND VERIFIED WITH: MATT MCBANE AT 0322 ON 04/22/17 [facility].    Methicillin resistance NOT DETECTED NOT DETECTED Final   Streptococcus species NOT DETECTED NOT DETECTED Final   Streptococcus agalactiae NOT DETECTED NOT DETECTED Final   Streptococcus pneumoniae NOT DETECTED NOT DETECTED Final   Streptococcus pyogenes NOT DETECTED NOT DETECTED Final   Acinetobacter baumannii NOT DETECTED NOT DETECTED Final   Enterobacteriaceae species NOT DETECTED NOT DETECTED Final   Enterobacter cloacae complex NOT DETECTED NOT DETECTED Final   Escherichia coli NOT DETECTED NOT DETECTED Final   Klebsiella oxytoca NOT DETECTED NOT DETECTED Final   Klebsiella pneumoniae NOT DETECTED NOT DETECTED Final   Proteus species NOT DETECTED NOT DETECTED Final   Serratia marcescens NOT DETECTED NOT DETECTED Final   Carbapenem resistance NOT DETECTED NOT DETECTED Final   Haemophilus influenzae NOT DETECTED NOT DETECTED Final   Neisseria meningitidis NOT DETECTED NOT DETECTED Final   Pseudomonas aeruginosa NOT DETECTED NOT DETECTED Final   Candida albicans NOT DETECTED NOT DETECTED Final   Candida glabrata NOT DETECTED NOT DETECTED Final   Candida krusei NOT DETECTED NOT DETECTED Final   Candida parapsilosis NOT DETECTED NOT DETECTED Final   Candida tropicalis NOT DETECTED NOT DETECTED Final    Comment: Performed at [facility], [address] [place], [place] [address]  Urine culture     Status: Abnormal   Collection Time: 04/28/17 10:49 AM  Result Value Ref Range Status   Specimen Description   Final    URINE, RANDOM Performed at [facility], [address] [place], [place] [address]    Special Requests   Final     Normal Performed at [facility], [address] [place], [place] [address]    Culture >=100,000 COLONIES/mL ESCHERICHIA COLI (A)  Final   Report Status 04/23/2017 FINAL  Final   Organism ID, Bacteria ESCHERICHIA COLI (A)  Final      Susceptibility   Escherichia coli - MIC*    AMPICILLIN >=32 RESISTANT Resistant     CEFAZOLIN <=4 SENSITIVE Sensitive     CEFTRIAXONE <=1 SENSITIVE Sensitive     CIPROFLOXACIN >=4 RESISTANT Resistant     GENTAMICIN <=1 SENSITIVE Sensitive     IMIPENEM <=0.25 SENSITIVE Sensitive     NITROFURANTOIN <=16 SENSITIVE Sensitive     TRIMETH/SULFA <=20 SENSITIVE Sensitive     AMPICILLIN/SULBACTAM 8 SENSITIVE Sensitive     PIP/TAZO <=4 SENSITIVE Sensitive     Extended ESBL NEGATIVE Sensitive     * >=100,000 COLONIES/mL ESCHERICHIA COLI  MRSA PCR Screening     Status: None   Collection Time: 04/28/17  5:30 PM  Result Value Ref Range Status   MRSA by PCR NEGATIVE NEGATIVE Final    Comment:  The GeneXpert MRSA Assay (FDA approved for NASAL specimens only), is one component of a comprehensive MRSA colonization surveillance program. It is not intended to diagnose MRSA infection nor to guide or monitor treatment for MRSA infections. Performed at [facility], [address] [place], [place] [address]   Culture, blood (single) w Reflex to ID Panel     Status: None (Preliminary result)   Collection Time: 04/22/17  4:47 PM  Result Value Ref Range Status   Specimen Description BLOOD RIGHT ANTECUBITAL  Final   Special Requests   Final    BOTTLES DRAWN AEROBIC AND ANAEROBIC Blood Culture adequate volume   Culture   Final    NO GROWTH 2 DAYS Performed at [facility], [address], [place], [place] [address]    Report Status PENDING  Incomplete  Aerobic/Anaerobic Culture (surgical/deep wound)     Status: None (Preliminary result)   Collection Time: 04/23/17  2:14 PM  Result Value Ref Range Status   Specimen Description    Final    GLAND RIGHT PAROTID Performed at [facility], [address] [place], [place] [address]    Special Requests   Final    NONE Performed at [facility], [address] [place], [place] [address]    Gram Stain   Final    ABUNDANT WBC PRESENT, PREDOMINANTLY PMN MODERATE GRAM POSITIVE COCCI IN CLUSTERS Performed at [facility], [address], [place], [place] [address]    Culture PENDING  Incomplete   Report Status PENDING  Incomplete    Medical History: Past Medical History:  Diagnosis Date  . Diabetes mellitus without complication (HCC)   . Hypertension     Medications:  Infusions:  .  ceFAZolin (ANCEF) IV Stopped (04/24/17 0950)  . potassium PHOSPHATE IVPB (mmol)    . vancomycin     Assessment: 66 yof with falls/confusion. MRI with scattered acute strokes. UCx E. Coli and BCx MSSA. She received a few doses of vancomycin IP, then ID saw her and recommended switch to Ancef. Now hospitalist consults to restart vancomycin.  Goal of Therapy:  Vancomycin trough level 15-20 mcg/ml  Plan:  Will change dose to vancomycin 750 mg IV q12h to maintain trough 15 to 20 mcg/mL.  Will need to monitor renal function closely as pt at risk for dose accumulation.   0.042, half life 16, Vd 63 L  BCx MSSA WCx pending GPC clusters UCx E coli sensitive to cefazolin  Marty Heck, Pharm.D., BCPS Clinical Pharmacist 04/24/2017,11:04 AM

## 2017-04-24 NOTE — Progress Notes (Signed)
Family Meeting Note  Advance Directive:yes  Today a meeting took place with the Patient's son(healthcare power of attorney), daughter-in-law and brother in the patient's room  Patient is unable to participate due WG:NFAOZHto:Lacked capacity Comatose   The following clinical team members were present during this meeting:MD  The following were discussed:Patient's diagnosis: Acute hypoxic respiratory failure with accessory muscle use, sepsis with possible endocarditis, acute CVA, hypernatremia, failure to thrive, with a drastic clinical deterioration; patient's progosis: Extremely poor and Goals for treatment: DNR with strict comfort care measures.  Palliative care is consulted  Additional follow-up to be provided: Hospitalist and palliative care  Time spent during discussion:20 minutes  Ramonita LabAruna Clifton Kovacic, MD

## 2017-04-24 NOTE — Progress Notes (Signed)
Patient is now comfort care only. Family at bedside. Patient given 1mg  morphine for tachypnea; transitioned from HFNC to 2L-O2 per Dr. Amado CoeGouru. Bereavement cart ordered. Emotional support provided. Bo McclintockBrewer,[place] Perra S, RN

## 2017-04-24 NOTE — Consult Note (Signed)
MEDICATION RELATED CONSULT NOTE   Pharmacy Consult for electrolytes Indication: hypokalemia  Allergies  Allergen Reactions  . Abilify [Aripiprazole]     Tardive dyskinesia     Patient Measurements: Height: 5\' 2"  (157.5 cm) Weight: 233 lb (105.7 kg) IBW/kg (Calculated) : 50.1 Adjusted Body Weight:   Vital Signs: Temp: 99.2 F (37.3 C) (01/27 0913) Temp Source: Axillary (01/27 0913) BP: 109/42 (01/27 0903) Pulse Rate: 84 (01/27 0904) Intake/Output from previous day: 01/26 0701 - 01/27 0700 In: 1615 [I.V.:915; IV Piggyback:700] Out: 1250 [Urine:1250] Intake/Output from this shift: Total I/O In: 100 [IV Piggyback:100] Out: -   Labs: Recent Labs    04/20/2017 1047 04/28/2017 1838 04/22/17 1125 04/22/17 1647 04/23/17 0748 04/24/17 0715  WBC 13.9*  --  7.5  --  9.2 11.0  HGB 15.3  --  13.5  --  13.0 12.4  HCT 47.9*  --  42.3  --  41.1 38.9  PLT 347  --  295  --  284 209  APTT 26  --   --   --   --   --   CREATININE 1.52*  --  0.89  --  0.93 0.99  MG  --  2.2  --  2.1  --  1.9  PHOS  --   --   --  2.3*  --  2.2*  ALBUMIN 3.5  --   --   --   --   --   PROT 7.5  --   --   --   --   --   AST 57*  --   --   --   --   --   ALT 46  --   --   --   --   --   ALKPHOS 88  --   --   --   --   --   BILITOT 1.3*  --   --   --   --   --    Estimated Creatinine Clearance: 64.7 mL/min (by C-G formula based on SCr of 0.99 mg/dL).   Assessment: Patient is a 67 year old female with severe malnutrition, no po intake for 1 week, sepsis, CVA, encephalopathy. K this AM=2.8. Mg yesterday was 2.2. Prime Dr ordered KCL 10 MEQ IV x 4. Pt also has D5W w/ 20 MEQ KCL @ 475ml/hr (1.5 MEQ/hr) hanging.  Goal of Therapy:  Normalization of electrolytes  Plan:  I have ordered an add on Mg and phos level. Replace these as needed. Continue with KCL IVPB runs x4. Recheck K @ 2100.  Addendum: add on Mg WNL, phos low. Will replace phos after K level results at 2100. K level will depend on how phos is  replaced. Will need to be IV  1/26: Pt is NPO K 3.5 this AM Mag 2.1, Phos 2.3 yesterday - not replaced Will order KPhos 7.5 mmol IV x1 for today Recheck labs in AM  1/27: K 3.3, Mag 1.9, Phos 2.2; remains NPO - phos lower despite supplementation Will order KPhos 15 mmol IV x1 for today  (Will provide 22.5 mEq of K) Recheck labs in AM   Crist FatHannah Treesa Mccully, PharmD, BCPS Clinical Pharmacist 04/24/2017 10:39 AM

## 2017-04-24 NOTE — Progress Notes (Addendum)
PT Cancellation Note  Patient Details Name: Sierra AmosCelia Staton MRN: [id number] DOB: 09/13/1950   Cancelled Treatment:    Reason Eval/Treat Not Completed: Medical issues which prohibited therapy(Per primary RN, patient remains in appropriate for PT intervention at this time due to medical status.  As patient with no significant change in 3 days, will complete order at this time.  Please re-consult as patient medically appropriate.)   Deaun Rocha H. Manson PasseyBrown, PT, DPT, NCS 04/24/17, 9:20 AM [phone]

## 2017-04-24 NOTE — Progress Notes (Signed)
[facility] [place] [facility]   PATIENT NAME: Sierra Bass    MR#:  [id number]  DATE OF BIRTH:  September 11, 1950  SUBJECTIVE:  CHIEF COMPLAINT:  Pt is  obtunded and working hard today using abdominal muscles to breathe.  Family members at bedside  REVIEW OF SYSTEMS:  Review of system unobtainable as the patient is encephalopathic  DRUG ALLERGIES:   Allergies  Allergen Reactions  . Abilify [Aripiprazole]     Tardive dyskinesia     VITALS:  Blood pressure (!) 109/42, pulse 84, temperature 99.2 F (37.3 C), temperature source Axillary, resp. rate (!) 32, height 5\' 2"  (1.575 m), weight 105.7 kg (233 lb), SpO2 97 %.  PHYSICAL EXAMINATION:  GENERAL:  67 y.o.-year-old patient lying in the bed with no acute distress.  EYES: Pupils equal, round, sluggishly reacting to light and accommodation.  HEENT: Head atraumatic, normocephalic.  Right maxillary area is edematous, tender NECK:  Supple, no jugular venous distention. No thyroid enlargement, no tenderness.  LUNGS: Moderately diminished breath sounds bilaterally, coarse breath sounds with rales and rhonchi , using accessory muscles of respiration.  CARDIOVASCULAR: S1, S2 normal. .  ABDOMEN: Soft, distended.  EXTREMITIES: No pedal edema, cyanosis, or clubbing.  NEUROLOGIC: obtunded PSYCHIATRIC: The patient is encephalopathic    LABORATORY PANEL:   CBC Recent Labs  Lab 04/24/17 0715  WBC 11.0  HGB 12.4  HCT 38.9  PLT 209   ------------------------------------------------------------------------------------------------------------------  Chemistries  Recent Labs  Lab 04/20/2017 1047  04/24/17 0715  NA 155*   < > 148*  K 3.2*   < > 3.3*  CL 113*   < > 112*  CO2 25   < > 29  GLUCOSE 253*   < > 180*  BUN 55*   < > 35*  CREATININE 1.52*   < > 0.99  CALCIUM 10.1   < > 8.6*  MG  --    < > 1.9  AST 57*  --   --   ALT 46  --   --   ALKPHOS 88  --   --   BILITOT 1.3*  --   --    < > = values in  this interval not displayed.   ------------------------------------------------------------------------------------------------------------------  Cardiac Enzymes Recent Labs  Lab 04/24/17 0715  TROPONINI 0.06*   ------------------------------------------------------------------------------------------------------------------  RADIOLOGY:  Ct Head Wo Contrast  Result Date: 04/22/2017 CLINICAL DATA:  Stroke follow-up.  Multiple falls. EXAM: CT HEAD WITHOUT CONTRAST TECHNIQUE: Contiguous axial images were obtained from the base of the skull through the vertex without intravenous contrast. COMPARISON:  Brain MRI 04/22/2017 Head CT 04/23/2017 FINDINGS: Brain: Multifocal hypoattenuation corresponding to areas of ischemia in the left frontal lobe, right parietal lobe and left occipital lobe. No acute hemorrhage. No mass effect. Brain parenchyma and CSF-containing spaces are normal for age. Vascular: No hyperdense vessel or unexpected calcification. Skull: Normal visualized skull base, calvarium and extracranial soft tissues. Sinuses/Orbits: No sinus fluid levels or advanced mucosal thickening. No mastoid effusion. Normal orbits. IMPRESSION: 1. Expected evolution of density changes at the multiple sites of ischemia demonstrated on recent MRI. 2. No hemorrhage or mass effect. Electronically Signed   By: Deatra Robinson M.D.   On: 04/22/2017 18:42   Dg Chest Port 1 View  Result Date: 04/24/2017 CLINICAL DATA:  Dyspnea.  History of hypertension and diabetes. EXAM: PORTABLE CHEST 1 VIEW COMPARISON:  04/23/2017 FINDINGS: Shallow inspiration with linear atelectasis in the lung bases. Mild cardiac enlargement. No vascular congestion or  edema. No blunting of costophrenic angles. No pneumothorax. Mediastinal contours appear intact. IMPRESSION: Shallow inspiration with linear atelectasis in the lung bases. Cardiac enlargement. No focal consolidation. Electronically Signed   By: Burman NievesWilliam  Stevens M.D.   On: 04/24/2017  00:58   Dg Chest Port 1 View  Result Date: 04/23/2017 CLINICAL DATA:  Hypoxic respiratory failure and sepsis. EXAM: PORTABLE CHEST 1 VIEW COMPARISON:  09-25-2017 FINDINGS: Stable top-normal heart size. Lung volumes are very low bilaterally. No focal airspace consolidation or pleural fluid identified. No evidence of pneumothorax. Possible component of large hiatal hernia. IMPRESSION: Low bilateral lung volumes.  Possible large hiatal hernia. Electronically Signed   By: Irish LackGlenn  Yamagata M.D.   On: 04/23/2017 11:14    EKG:   Orders placed or performed during the hospital encounter of 03/27/17  . ED EKG  . ED EKG  . EKG 12-Lead  . EKG 12-Lead    ASSESSMENT AND PLAN:    67 year old female with a history of diabetes and essential hypertension who has had multiple falls over the past several days and now presents to the emergency room with confusion and right-sided weakness.  #Acute respiratory failure with hypoxemia  #adult failure to thrive #. Sepsis: Patient presents with leukocytosis, tachypnea, tachycardia and elevated lactic acid   # Acute/subacute right cerebellar infarct:  #  Acute encephalopathy in the setting of sepsis with possible cerebellar infarct and hypernatremia #  Hypernatremia   5. Diabetes:  6. Acute kidney injury in the setting of dehydration and poor by mouth intake   7. Essential hypertension:   8. Urinary incontinence:   9. Hyperlipidemia:    Condition guarded with extremely poor prognosis, family members are aware.  Clinical situation is drastically deteriorating.  Family members anonymously has decided to change her CODE STATUS to DNR with comfort care measures  Provide Tylenol as needed, morphine as needed, Robinul as needed Oxygen via nasal cannula Palliative care consult  CODE STATUS: fc   TOTAL TIME TAKING CARE OF THIS PATIENT: 32 minutes.   POSSIBLE D/C IN ?  DAYS, DEPENDING ON CLINICAL CONDITION.  Note: This dictation was prepared  with Dragon dictation along with smaller phrase technology. Any transcriptional errors that result from this process are unintentional.   Ramonita LabAruna Khaliyah Northrop M.D on 04/24/2017 at 11:58 AM  Between 7am to 6pm - Pager - [phone] After 6pm go to [email or web address] - password [email or web address] [facility]  [facility]  Office  [phone]  CC: Primary care physician; Associates, [facility]

## 2017-04-24 NOTE — Progress Notes (Signed)
ID E note Fevers persist. Cx from parotid growing GPC in clusters. FU BCXneg 1/25. UCX S to ancef. ECHO P    Sierra AmosCelia Bass is a 67 y.o. female admitted with AMS and falls and found to have multiple strokes, MSSA bacteremia, Parotitis. She is quite ill.  Most likely she had CVA and became dehydrated and then developed parotitis leading to the MSSA bacteremia.  Has E coli UTI as well.  However she may have endocarditis leading to septic CNS emboli. Will need wu for endocarditis.  Recommendations Echo Pending  Continue ancef If aggressive care pursued.may need TEE when stable but hold for now as will not change management  Poor prognosis.

## 2017-04-25 DIAGNOSIS — I639 Cerebral infarction, unspecified: Secondary | ICD-10-CM

## 2017-04-25 DIAGNOSIS — F411 Generalized anxiety disorder: Secondary | ICD-10-CM

## 2017-04-25 DIAGNOSIS — R6520 Severe sepsis without septic shock: Secondary | ICD-10-CM

## 2017-04-25 DIAGNOSIS — A419 Sepsis, unspecified organism: Secondary | ICD-10-CM

## 2017-04-25 DIAGNOSIS — E860 Dehydration: Secondary | ICD-10-CM

## 2017-04-25 DIAGNOSIS — W19XXXA Unspecified fall, initial encounter: Secondary | ICD-10-CM

## 2017-04-25 DIAGNOSIS — R7881 Bacteremia: Secondary | ICD-10-CM

## 2017-04-25 DIAGNOSIS — Z515 Encounter for palliative care: Secondary | ICD-10-CM

## 2017-04-25 LAB — BLOOD GAS, VENOUS
ACID-BASE EXCESS: 1.4 mmol/L (ref 0.0–2.0)
BICARBONATE: 26.6 mmol/L (ref 20.0–28.0)
PCO2 VEN: 43 mmHg — AB (ref 44.0–60.0)
PH VEN: 7.4 (ref 7.250–7.430)
Patient temperature: 37

## 2017-04-25 MED ORDER — MORPHINE BOLUS VIA INFUSION
2.0000 mg | INTRAVENOUS | Status: DC | PRN
  Administered 2017-04-25 (×4): 2 mg via INTRAVENOUS
  Filled 2017-04-25: qty 2

## 2017-04-25 MED ORDER — MORPHINE BOLUS VIA INFUSION
2.0000 mg | INTRAVENOUS | Status: DC | PRN
  Administered 2017-04-25: 4 mg via INTRAVENOUS
  Administered 2017-04-25: 19:00:00 2 mg via INTRAVENOUS
  Administered 2017-04-25: 4 mg via INTRAVENOUS
  Administered 2017-04-25: 23:00:00 2 mg via INTRAVENOUS
  Administered 2017-04-26: 4 mg via INTRAVENOUS
  Filled 2017-04-25: qty 4

## 2017-04-25 MED ORDER — LORAZEPAM 2 MG/ML IJ SOLN
2.0000 mg | INTRAMUSCULAR | Status: DC | PRN
  Administered 2017-04-25 (×4): 2 mg via INTRAVENOUS
  Filled 2017-04-25 (×4): qty 1

## 2017-04-25 MED ORDER — LORAZEPAM 2 MG/ML IJ SOLN
1.0000 mg | INTRAMUSCULAR | Status: DC | PRN
  Administered 2017-04-25: 09:00:00 1 mg via INTRAVENOUS
  Filled 2017-04-25: qty 1

## 2017-04-25 NOTE — Care Management Important Message (Signed)
Important Message  Patient Details  Name: Sierra AmosCelia Phillis MRN: [id number] Date of Birth: 04/16/1950   Medicare Important Message Given:  Yes    Gwenette GreetBrenda S Holli Rengel, RN 04/25/2017, 7:23 AM

## 2017-04-25 NOTE — Progress Notes (Signed)
Nutrition Brief Note  Chart reviewed. Pt now transitioning to comfort care.  No further nutrition interventions warranted at this time.  Please re-consult as needed.   Bridgit Eynon M. Jezreel Sisk, MS, RD LDN Inpatient Clinical Dietitian Pager [phone]    

## 2017-04-25 NOTE — Progress Notes (Signed)
°   04/25/17 1320  Clinical Encounter Type  Visited With Family  Visit Type Follow-up  Referral From Nurse  Consult/Referral To Chaplain  Spiritual Encounters  Spiritual Needs Prayer;Emotional;Grief support   [facility] comfort care cart beside PT's door. CH spoke with family member and went into PT's RM. CH prayed for PT and her family.

## 2017-04-25 NOTE — Progress Notes (Signed)
[facility] - [place] at [facility]   PATIENT NAME: Sierra AmosCelia Needham    MR#:  [id number]  DATE OF BIRTH:  05/30/1950  SUBJECTIVE:  CHIEF COMPLAINT:  Pt is  obtunded and moaning while being on morphine drip.  Family members at bedside, more comfortable after giving Ativan  REVIEW OF SYSTEMS:  Review of system unobtainable as the patient is encephalopathic  DRUG ALLERGIES:   Allergies  Allergen Reactions  . Abilify [Aripiprazole]     Tardive dyskinesia     VITALS:  Blood pressure 140/82, pulse 72, temperature (!) 97.5 F (36.4 C), temperature source Oral, resp. rate 17, height 5\' 2"  (1.575 m), weight 105.7 kg (233 lb), SpO2 (!) 89 %.  PHYSICAL EXAMINATION:  GENERAL:  67 y.o.-year-old patient lying in the bed with no acute distress.  EYES: Pupils equal, round, sluggishly reacting to light and accommodation.  HEENT: Head atraumatic, normocephalic.  Right maxillary area is edematous, tender NECK:  Supple, no jugular venous distention. No thyroid enlargement, no tenderness.  LUNGS: Moderately diminished breath sounds bilaterally, coarse breath sounds with rales and rhonchi , using accessory muscles of respiration.  CARDIOVASCULAR: S1, S2 normal. .  ABDOMEN: Soft, distended.  EXTREMITIES: No pedal edema, cyanosis, or clubbing.  NEUROLOGIC: obtunded PSYCHIATRIC: The patient is encephalopathic    LABORATORY PANEL:   CBC Recent Labs  Lab 04/24/17 0715  WBC 11.0  HGB 12.4  HCT 38.9  PLT 209   ------------------------------------------------------------------------------------------------------------------  Chemistries  Recent Labs  Lab 04/23/2017 1047  04/24/17 0715  NA 155*   < > 148*  K 3.2*   < > 3.3*  CL 113*   < > 112*  CO2 25   < > 29  GLUCOSE 253*   < > 180*  BUN 55*   < > 35*  CREATININE 1.52*   < > 0.99  CALCIUM 10.1   < > 8.6*  MG  --    < > 1.9  AST 57*  --   --   ALT 46  --   --   ALKPHOS 88  --   --   BILITOT 1.3*  --   --    < > = values in this interval not displayed.   ------------------------------------------------------------------------------------------------------------------  Cardiac Enzymes Recent Labs  Lab 04/24/17 0715  TROPONINI 0.06*   ------------------------------------------------------------------------------------------------------------------  RADIOLOGY:  Dg Chest Port 1 View  Result Date: 04/24/2017 CLINICAL DATA:  Dyspnea.  History of hypertension and diabetes. EXAM: PORTABLE CHEST 1 VIEW COMPARISON:  04/23/2017 FINDINGS: Shallow inspiration with linear atelectasis in the lung bases. Mild cardiac enlargement. No vascular congestion or edema. No blunting of costophrenic angles. No pneumothorax. Mediastinal contours appear intact. IMPRESSION: Shallow inspiration with linear atelectasis in the lung bases. Cardiac enlargement. No focal consolidation. Electronically Signed   By: Burman NievesWilliam  Stevens M.D.   On: 04/24/2017 00:58    EKG:   Orders placed or performed during the hospital encounter of 03/27/17  . ED EKG  . ED EKG  . EKG 12-Lead  . EKG 12-Lead    ASSESSMENT AND PLAN:    67 year old female with a history of diabetes and essential hypertension who has had multiple falls over the past several days and now presents to the emergency room with confusion and right-sided weakness.  #Acute respiratory failure with hypoxemia  #adult failure to thrive #. Sepsis: Patient presents with leukocytosis, tachypnea, tachycardia and elevated lactic acid   # Acute/subacute right cerebellar infarct:  #  Acute encephalopathy in the  setting of sepsis with possible cerebellar infarct and hypernatremia #  Hypernatremia   5. Diabetes:  6. Acute kidney injury in the setting of dehydration and poor by mouth intake   7. Essential hypertension:   8. Urinary incontinence:   9. Hyperlipidemia:    Condition guarded with extremely poor prognosis, family members are aware.  Clinical  situation is drastically deteriorating.  Family members anonymously has decided to change her CODE STATUS to DNR with comfort care measures  Provide Tylenol as needed, Ativan as needed morphine gtt, Robinul as needed Oxygen via nasal cannula Anticipate hospital death Palliative care is following  CODE STATUS: fc   TOTAL TIME TAKING CARE OF THIS PATIENT: 28 minutes.   POSSIBLE D/C IN ?  DAYS, DEPENDING ON CLINICAL CONDITION.  Note: This dictation was prepared with Dragon dictation along with smaller phrase technology. Any transcriptional errors that result from this process are unintentional.   Ramonita Lab M.D on 04/25/2017 at 4:06 PM  Between 7am to 6pm - Pager - [phone] After 6pm go to [email or web address] - password [email or web address] [facility]  [facility] [place] [facility]  Office  [phone]  CC: Primary care physician; [facility]

## 2017-04-25 NOTE — Consult Note (Signed)
Consultation Note Date: 04/25/2017   Patient Name: Sierra Bass  DOB: [birth year]  MRN: [id number]  Age / Sex: 67 y.o., female  PCP: [facility] [place] Referring Physician: Nicholes Mango, MD  Reason for Consultation: Establishing goals of care and Terminal Care  HPI/Patient Profile: 67 y.o. female  with past medical history of hypertension, diabetes mellitus, depression, and smoker admitted on 03/29/2017 with falls and right sided weakness. In ED, patient presenting with sepsis. CT revealed possible stroke. MRI/MRA revealed small acute infarcts in cerebral hemispheres bilaterally. Patient found to have urine culture with E.Coli and MSSA bacteremia. CT face ordered for right maxillary swelling, found to be parotitis. Concern for endocarditis. Patient has continued to clinically decline with unresponsiveness and acute respiratory failure. Family has decided against aggressive interventions and transitioned to comfort measures only on 1/27. Palliative medicine consultation for terminal care.    Clinical Assessment and Goals of Care: I have reviewed medical records, discussed with care team, and met with son Merry Proud) and daughter-in-law Manuela Schwartz) at bedside to discuss diagnosis prognosis, and EOL wishes.   Introduced Palliative Medicine as specialized medical care for people living with serious illness. It focuses on providing relief from the symptoms and stress of a serious illness.   Discussed events leading up to hospitalization, diagnoses, and interventions. Prior to hospitalization, patient living independently but with multiple falls in the last 6-8 weeks per family. Merry Proud and Manuela Schwartz tell me she did not take care of herself including obesity and current smoker.   Family has a good understanding of poor prognosis secondary to stroke, infection, and overall failure to thrive. Merry Proud is an only child. He  confirms wanting "comfort" for his mother.   During our conversation, patient continues to moan with exhale. She does not exhibit signs of pain including facial grimacing or brow furrowing, but the moaning is seen as a sign of discomfort, possibly anxiety? Family tells me she has history of depression and anxiety, on Effexor.   We discussed symptom management medications. Patient's morphine gtt was recently increased. I discussed adding ativan as needed for moaning/anxiety. Family agrees.   Manuela Schwartz asks about prognosis. I explained hours-days at this point.   Questions and concerns were addressed. PMT contact information given. Merry Proud and Manuela Schwartz share pictures of Ms. Nolley. Emotional/spiritual support provided. They understand chaplain is available day or night.  **F/u after ativan given. Patient appears comfortable without s/s of distress.    SUMMARY OF RECOMMENDATIONS    DNR/DNI  Comfort measures only.   Symptom management--see below.  Prognosis hours-days. Anticipate hospital death. Will further discuss hospice facility if appropriate.   Code Status/Advance Care Planning:  DNR   Symptom Management:   Continue morphine infusion 34m/hr  RN may bolus morphine via infusion [id number] pain/dyspnea/air hunger  Ativan 1mg7m q2h prn anxiety  Robinul 0.2mg [id number] prn secretions  Palliative Prophylaxis:   Aspiration, Delirium Protocol, Frequent Pain Assessment, Oral Care and Turn Reposition  Additional Recommendations (Limitations, Scope, Preferences):  Full Comfort Care  Psycho-social/Spiritual:  Desire for further Chaplaincy support: yes  Additional Recommendations: Caregiving  Support/Resources and Education on Hospice  Prognosis:   Hours - Days  Discharge Planning: To Be Determined      Primary Diagnoses: Present on Admission: . Sepsis ([place])   I have reviewed the medical record, interviewed the patient and family, and examined the patient. The  following aspects are pertinent.  Past Medical History:  Diagnosis Date  . Diabetes mellitus without complication ([place])   . Hypertension    Social History   Socioeconomic History  . Marital status: Divorced    Spouse name: None  . Number of children: None  . Years of education: None  . Highest education level: None  Social Needs  . Financial resource strain: None  . Food insecurity - worry: None  . Food insecurity - inability: None  . Transportation needs - medical: None  . Transportation needs - non-medical: None  Occupational History  . None  Tobacco Use  . Smoking status: Current Every Day Smoker    Packs/day: 1.00    Types: Cigarettes  . Smokeless tobacco: Never Used  Substance and Sexual Activity  . Alcohol use: No    Frequency: Never  . Drug use: Yes    Types: Marijuana    Comment: Per family 6 months ago  . Sexual activity: None  Other Topics Concern  . None  Social History Narrative  . None   Family History  Problem Relation Age of Onset  . Stroke Mother   . Cancer Father    Scheduled Meds: . aspirin  300 mg Rectal Daily   Or  . aspirin EC  325 mg Oral Daily   Continuous Infusions: . morphine 10 mg/hr (04/25/17 0832)   PRN Meds:.acetaminophen **OR** [DISCONTINUED] acetaminophen (TYLENOL) oral liquid 160 mg/5 mL **OR** acetaminophen, acetaminophen **OR** acetaminophen, antiseptic oral rinse, furosemide, glycopyrrolate **OR** glycopyrrolate **OR** glycopyrrolate, haloperidol **OR** haloperidol **OR** haloperidol lactate, LORazepam, morphine injection, morphine, ondansetron **OR** ondansetron (ZOFRAN) IV, polyvinyl alcohol Medications Prior to Admission:  Prior to Admission medications   Medication Sig Start Date End Date Taking? Authorizing Provider  gabapentin (NEURONTIN) 600 MG tablet Take 600 mg by mouth at bedtime.  11/27/16  Yes [provider]  lisinopril (PRINIVIL,ZESTRIL) 40 MG tablet Take 1 tablet by mouth daily. 03/02/17  Yes [provider]  meloxicam (MOBIC) 15 MG tablet Take 1 tablet by mouth daily. 12/29/16  Yes [provider]  metFORMIN (GLUCOPHAGE) 1000 MG tablet Take 1 tablet by mouth 2 (two) times daily. 12/06/16  Yes [provider]  oxybutynin (DITROPAN-XL) 10 MG 24 hr tablet Take 10 mg by mouth 3 (three) times daily.  02/23/17  Yes [provider]  pramipexole (MIRAPEX) 1.5 MG tablet Take 1 tablet by mouth daily. 01/03/17  Yes [provider]  simvastatin (ZOCOR) 10 MG tablet Take 1 tablet by mouth daily. 12/23/16  Yes [provider]  venlafaxine XR (EFFEXOR-XR) 150 MG 24 hr capsule Take 2 capsules by mouth daily. 11/27/16  Yes [provider]   Allergies  Allergen Reactions  . Abilify [Aripiprazole]     Tardive dyskinesia    Review of Systems  Unable to perform ROS: Acuity of condition   Physical Exam  Constitutional: She appears ill.  HENT:  Head: Normocephalic and atraumatic.  Cardiovascular: Regular rhythm.  Pulmonary/Chest: No accessory muscle usage. No tachypnea. No respiratory distress. She has decreased breath sounds.  Abdominal: Normal appearance.  Neurological: She is unresponsive.  Moaning with each exhale.  RN to give ativan.   Skin: Skin is warm and dry. There is pallor.  Nursing note and vitals reviewed.  Vital Signs: BP 140/82 (BP Location: Left Arm)   Pulse 72   Temp (!) 97.5 F (36.4 C) (Oral)   Resp 17   Ht 5' 2"  (1.575 m)   Wt 105.7 kg (233 lb)   SpO2 (!) 89%   BMI 42.62 kg/m  Pain Assessment: PAINAD POSS *See Group Information*: 4-INTERVENTION REQUIRED,Unacceptable,Somnolent, mininal or no response to verbal and physical stimulation Pain Score: Asleep  SpO2: SpO2: (!) 89 % O2 Device:SpO2: (!) 89 % O2 Flow Rate: .O2 Flow Rate (L/min): 2 L/min  IO: Intake/output summary:   Intake/Output Summary (Last 24 hours) at 04/25/2017 7017 Last data filed at 04/25/2017 0606 Gross per 24 hour  Intake 100 ml  Output 1800 ml    Net -1700 ml    LBM: Last BM Date: (last BM unknown) Baseline Weight: Weight: 105.7 kg (233 lb) Most recent weight: Weight: 105.7 kg (233 lb)     Palliative Assessment/Data: PPS 10%   Flowsheet Rows     Most Recent Value  Intake Tab  Referral Department  Hospitalist  Unit at Time of Referral  Med/Surg Unit  Palliative Care Primary Diagnosis  Sepsis/Infectious Disease  Palliative Care Type  New Palliative care  Reason for referral  End of Life Care Assistance, Clarify Goals of Care  Date first seen by Palliative Care  04/25/17  Clinical Assessment  Palliative Performance Scale Score  10%  Psychosocial & Spiritual Assessment  Palliative Care Outcomes  Patient/Family meeting held?  Yes  Who was at the meeting?  son and daughter-in-law  Palliative Care Outcomes  Clarified goals of care, Provided end of life care assistance, Improved pain interventions, Improved non-pain symptom therapy      Time In/Out: 0830-0930, 1010-1025 Time Total: 29mn Greater than 50%  of this time was spent counseling and coordinating care related to the above assessment and plan.  Signed by:  MIhor Dow FNP-C Palliative Medicine Team  Phone: [phone]: [phone]  Please contact Palliative Medicine Team phone at [phone] questions and concerns.  For individual provider: See AShea Evans

## 2017-04-26 MED ORDER — GLYCOPYRROLATE 0.2 MG/ML IJ SOLN
0.4000 mg | INTRAMUSCULAR | Status: DC
  Filled 2017-04-26 (×2): qty 2

## 2017-04-27 LAB — CULTURE, BLOOD (SINGLE)
Culture: NO GROWTH
SPECIAL REQUESTS: ADEQUATE

## 2017-04-28 LAB — AEROBIC/ANAEROBIC CULTURE W GRAM STAIN (SURGICAL/DEEP WOUND)

## 2017-04-28 LAB — AEROBIC/ANAEROBIC CULTURE (SURGICAL/DEEP WOUND)

## 2017-04-29 NOTE — Discharge Summary (Signed)
Death summary:  Date of admission 02-13-2018 Date of death 04/18/2017  hpi  Sierra Bass  is a 67 y.o. female with a known history of diabetes and essential hypertension who presents via EMS due to falls. Son is at bedside. Patient has confusion and therefore I am unable to obtain history of present illness. Patient's son reports that on Saturday came to visit her and she was on the floor. He had to assist her back in the bed. He reports that she was talking and did not appear confused. He checked on her on Wednesday because he has not heard from her and again she was on the floor next to the bed. She seemed confused to him. Again today he went to check on her and she was very confused and was on the floor. Patient has had multiple falls in the past several days.  In the emergency room chest x-ray shows no acute infiltrate. UA is pending. CT shows possible as CVA.    #Acute respiratory failure with hypoxemia  #adult failure to thrive #. Sepsis: Patient presents with leukocytosis, tachypnea, tachycardia and elevated lactic acid   # Acute/subacute right cerebellar infarct:  #  Acute encephalopathy in the setting of sepsis with possible cerebellar infarct and hypernatremia #  Hypernatremia   5. Diabetes:  6. Acute kidney injury in the setting of dehydration and poor by mouth intake   7. Essential hypertension:   8. Urinary incontinence:   9. Hyperlipidemia:    During the hospital course  patient with extremely poor prognosis, family members are aware.  Clinical situation  drastically deteriorated.  Family members anonymously has decided to change her CODE STATUS to DNR with comfort care measures  Comfort care measures implemented and patient being unsteady anticipating hospital death.  Patient was diseased today at around 8:30 AM

## 2017-04-29 DEATH — deceased

## 2018-06-20 IMAGING — CT CT HEAD W/O CM
5 of 9 series · 15 of 47 positions shown, 16 images · non-contrast
Comparison: Brain MRI 04/22/2017

Head CT 04/21/2017

CLINICAL DATA: Stroke follow-up.  Multiple falls.

EXAM:
CT HEAD WITHOUT CONTRAST
TECHNIQUE: Contiguous axial images were obtained from the base of the skull
through the vertex without intravenous contrast.

[Series 3: head (person_name) (person_name) · axial · 0.45mm/px · z∈[+503,+608]mm · 4 of 35 slices shown, 5 images]
[im 7/35  brain]
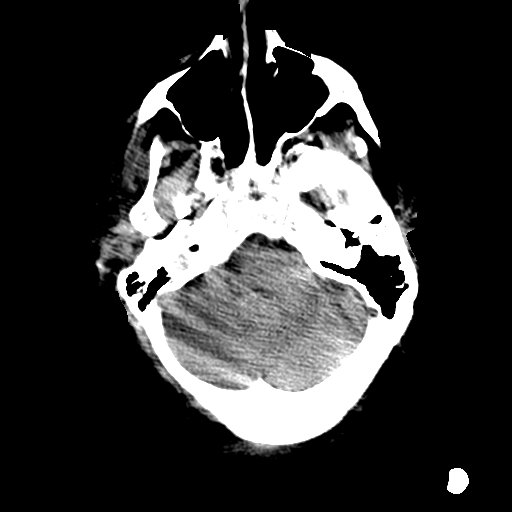
[im 7/35  bone]
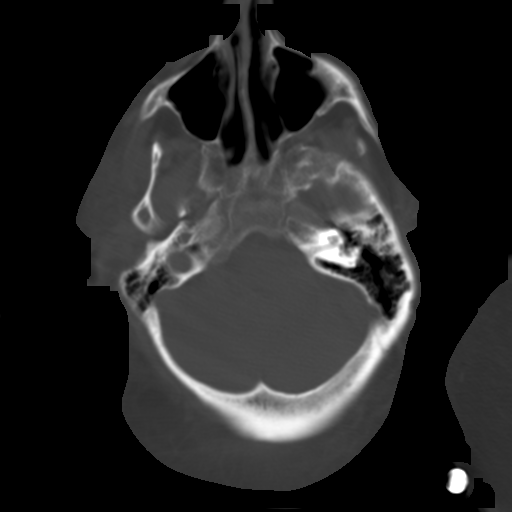
[im 14/35  brain]
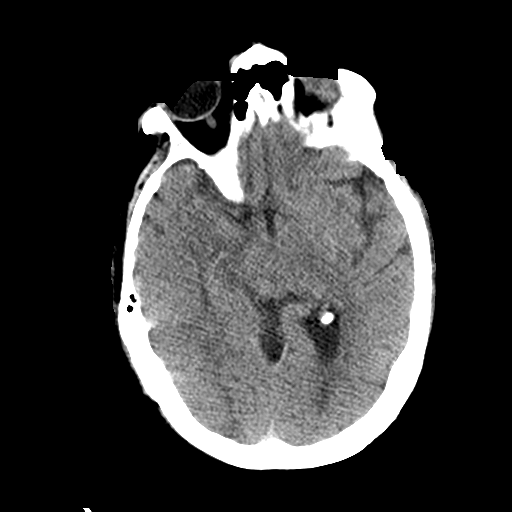
[im 21/35  brain]
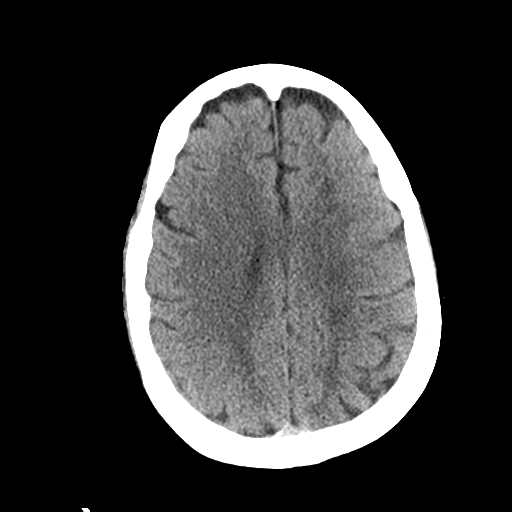
[im 28/35  brain]
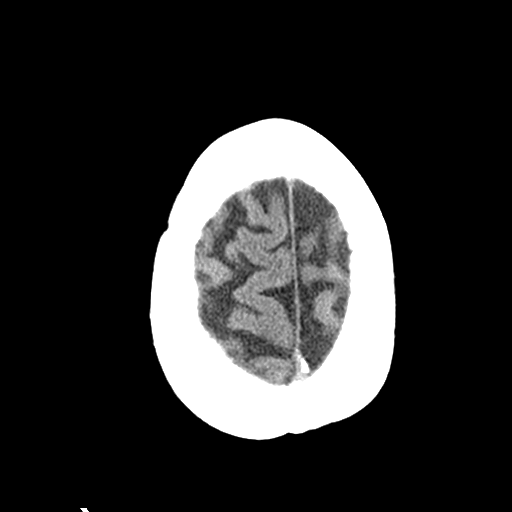

[Series 6: ax head wo · axial · 0.36mm/px · z∈[+549,+619]mm · 3 of 31 slices shown (1 of 2)]
[im 8/31  brain]
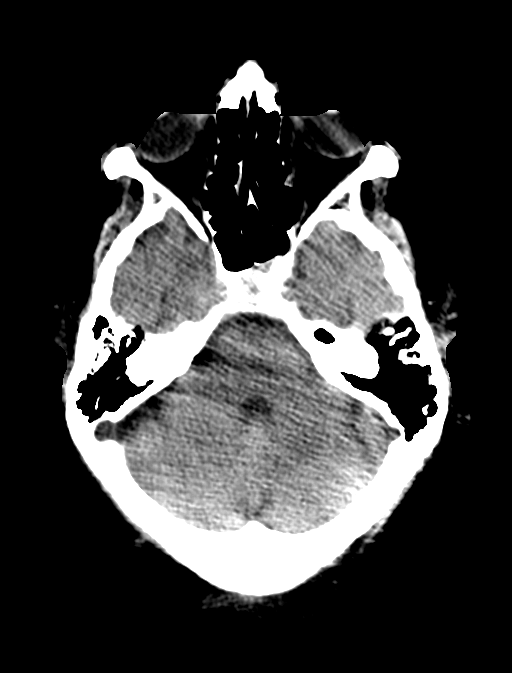
[im 16/31  brain]
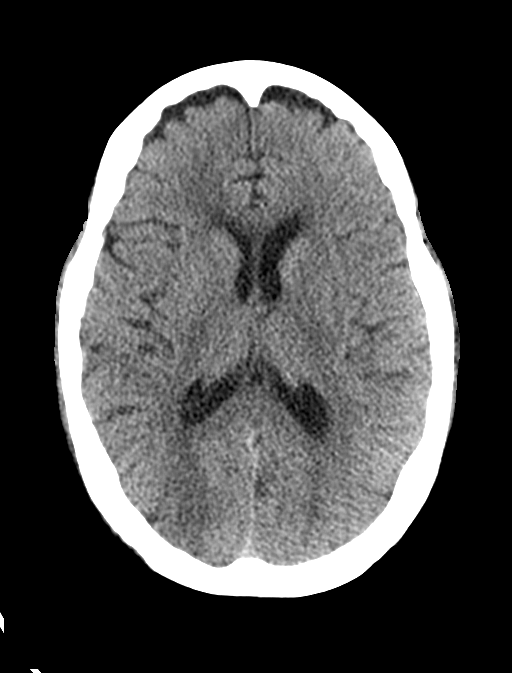
[im 23/31  brain]
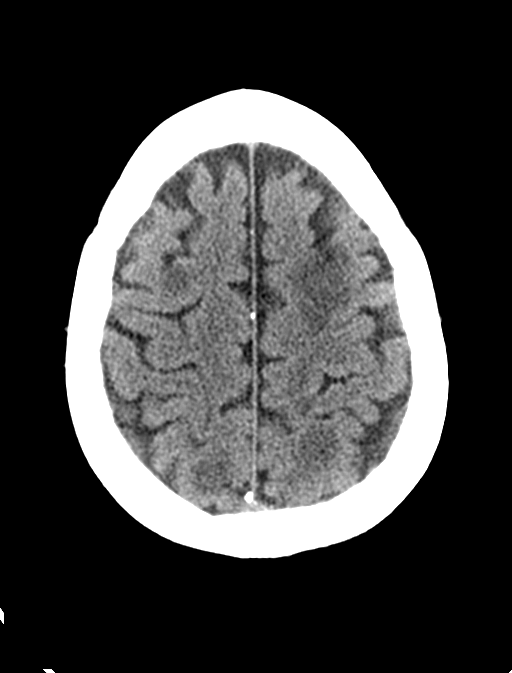

[Series 8: ax head wo · axial · 0.36mm/px · z∈[+540,+572]mm · 2 of 23 slices shown (2 of 2)]
[im 8/23  brain]
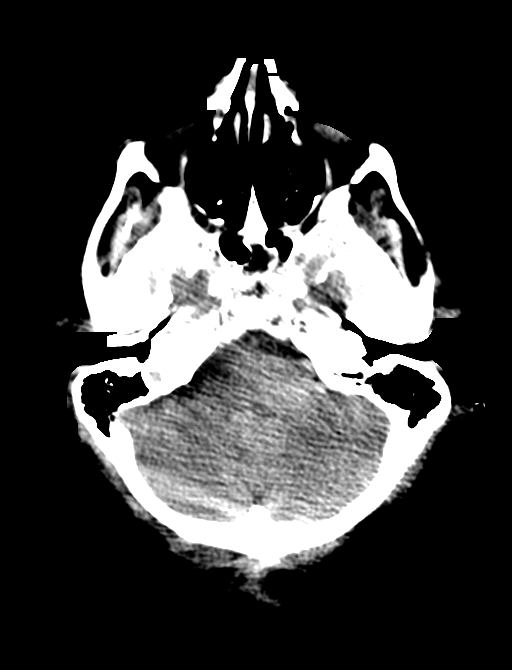
[im 15/23  brain]
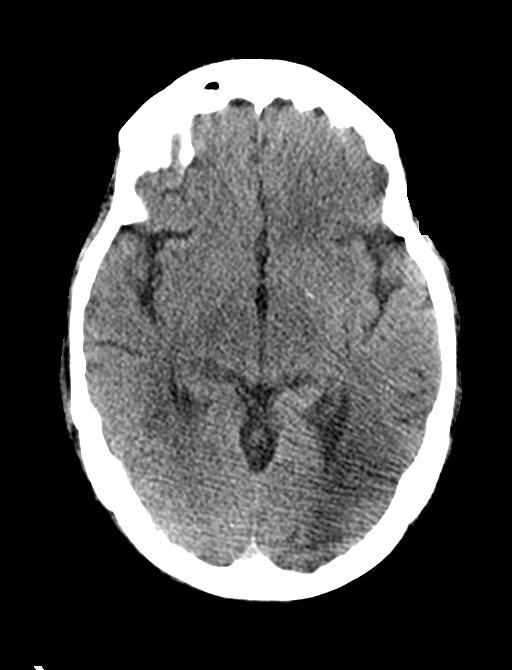

[Series 9: coronal soft tissue · coronal · 0.36mm/px · 3 of 67 slices shown]
[im 13/67  brain]
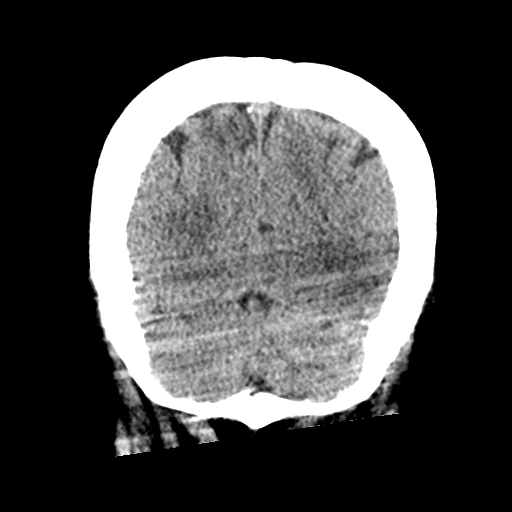
[im 26/67  brain]
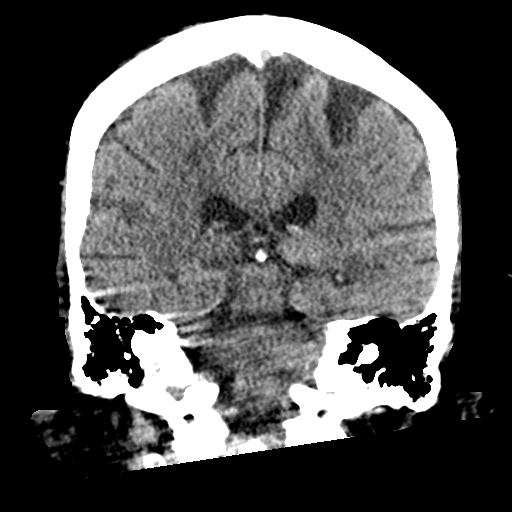
[im 38/67  brain]
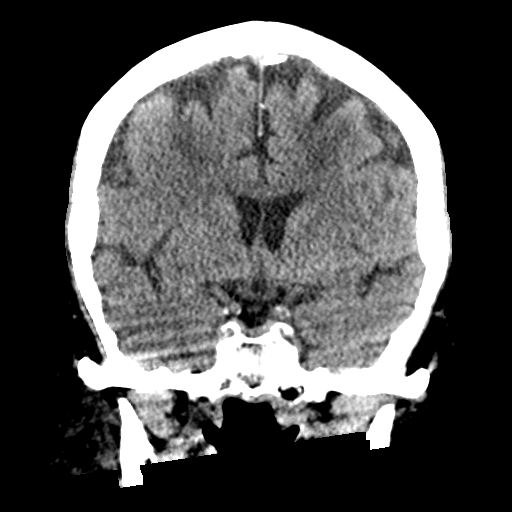

[Series 12: sagittal soft tissue · sagittal · 0.25mm/px · 3 of 50 slices shown]
[im 10/50  brain]
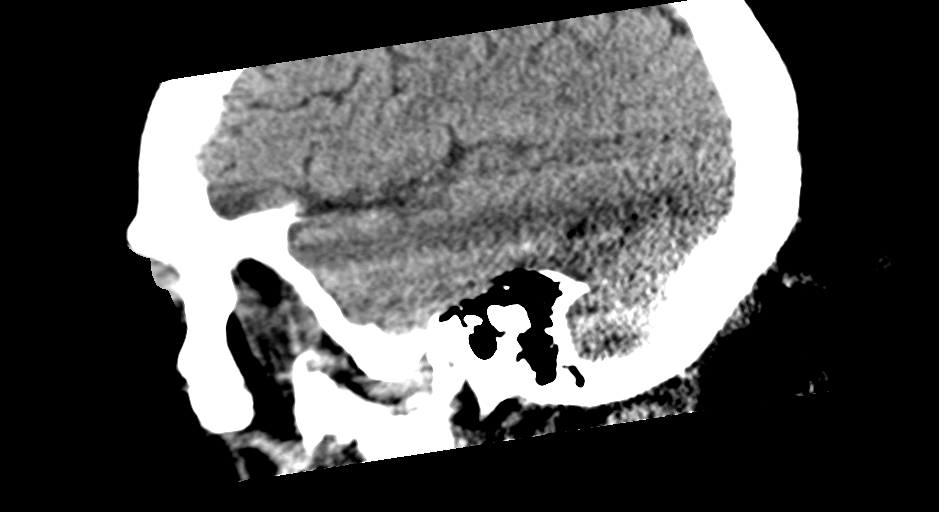
[im 20/50  brain]
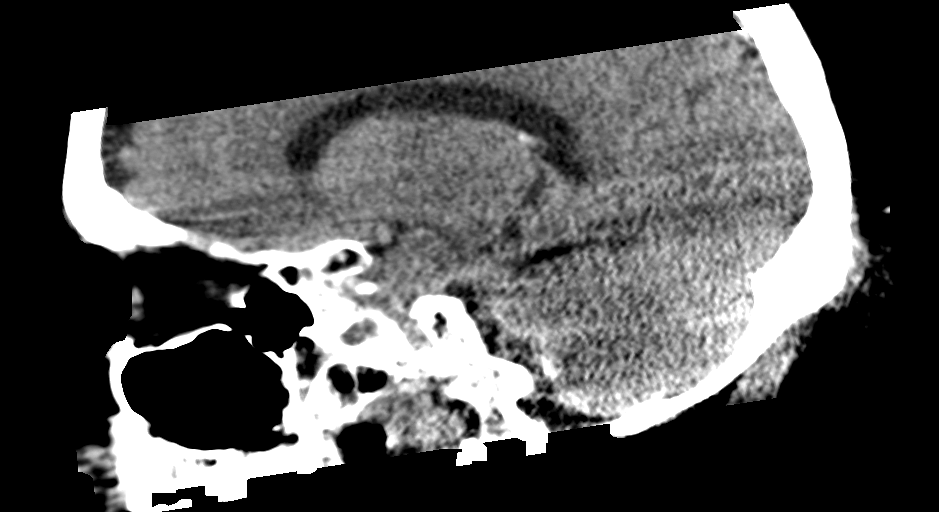
[im 30/50  brain]
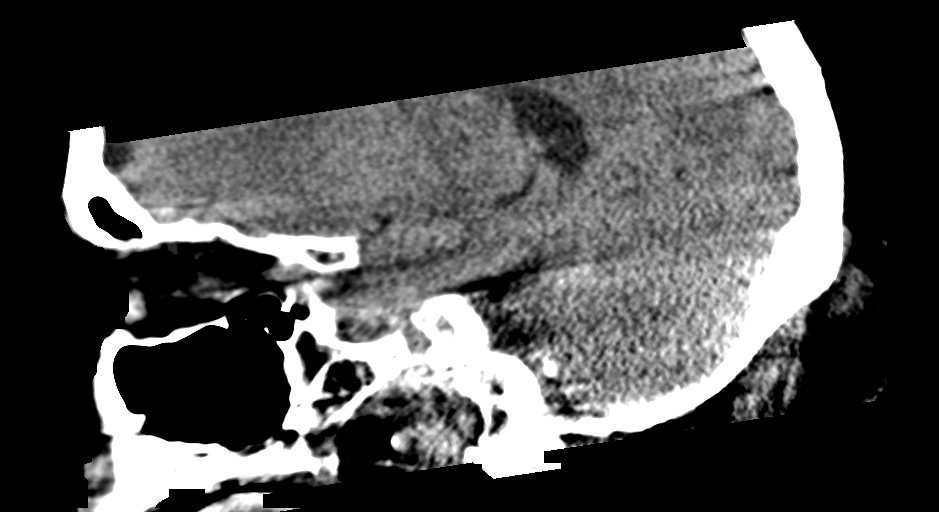

[15 of 47 positions shown; findings below may reference images not displayed]

FINDINGS: Brain: Multifocal hypoattenuation corresponding to areas of ischemia
in the left frontal lobe, right parietal lobe and left occipital
lobe. No acute hemorrhage. No mass effect. Brain parenchyma and
CSF-containing spaces are normal for age.

Vascular: No hyperdense vessel or unexpected calcification.

Skull: Normal visualized skull base, calvarium and extracranial soft
tissues.

Sinuses/Orbits: No sinus fluid levels or advanced mucosal
thickening. No mastoid effusion. Normal orbits.
IMPRESSION: 1. Expected evolution of density changes at the multiple sites of
ischemia demonstrated on recent MRI.
2. No hemorrhage or mass effect.
